# Patient Record
Sex: Female | Born: 1965 | Race: Black or African American | Hispanic: No | Marital: Single | Smoking: Former smoker
Health system: Southern US, Community
[De-identification: ages and names within clinical notes are randomized; demographics above are authoritative.]

## PROBLEM LIST (undated history)

## (undated) DIAGNOSIS — L309 Dermatitis, unspecified: Secondary | ICD-10-CM

## (undated) DIAGNOSIS — J45909 Unspecified asthma, uncomplicated: Secondary | ICD-10-CM

## (undated) DIAGNOSIS — E119 Type 2 diabetes mellitus without complications: Secondary | ICD-10-CM

## (undated) DIAGNOSIS — E785 Hyperlipidemia, unspecified: Secondary | ICD-10-CM

## (undated) DIAGNOSIS — I10 Essential (primary) hypertension: Secondary | ICD-10-CM

## (undated) HISTORY — DX: Dermatitis, unspecified: L30.9

## (undated) HISTORY — DX: Unspecified asthma, uncomplicated: J45.909

## (undated) HISTORY — DX: Hyperlipidemia, unspecified: E78.5

## (undated) HISTORY — PX: APPENDECTOMY: SHX54

---

## 2011-02-14 DIAGNOSIS — M129 Arthropathy, unspecified: Secondary | ICD-10-CM | POA: Insufficient documentation

## 2011-02-14 DIAGNOSIS — J310 Chronic rhinitis: Secondary | ICD-10-CM | POA: Insufficient documentation

## 2011-02-14 DIAGNOSIS — R1013 Epigastric pain: Secondary | ICD-10-CM | POA: Insufficient documentation

## 2011-02-14 DIAGNOSIS — E669 Obesity, unspecified: Secondary | ICD-10-CM | POA: Insufficient documentation

## 2011-02-14 DIAGNOSIS — Z720 Tobacco use: Secondary | ICD-10-CM | POA: Insufficient documentation

## 2011-02-14 DIAGNOSIS — G4700 Insomnia, unspecified: Secondary | ICD-10-CM | POA: Insufficient documentation

## 2013-12-20 DIAGNOSIS — M25561 Pain in right knee: Secondary | ICD-10-CM | POA: Insufficient documentation

## 2013-12-20 DIAGNOSIS — R600 Localized edema: Secondary | ICD-10-CM | POA: Insufficient documentation

## 2013-12-20 DIAGNOSIS — M7989 Other specified soft tissue disorders: Secondary | ICD-10-CM | POA: Insufficient documentation

## 2013-12-20 DIAGNOSIS — E119 Type 2 diabetes mellitus without complications: Secondary | ICD-10-CM | POA: Insufficient documentation

## 2013-12-20 DIAGNOSIS — I10 Essential (primary) hypertension: Secondary | ICD-10-CM | POA: Insufficient documentation

## 2016-11-25 DIAGNOSIS — H9209 Otalgia, unspecified ear: Secondary | ICD-10-CM | POA: Insufficient documentation

## 2016-11-25 DIAGNOSIS — H6090 Unspecified otitis externa, unspecified ear: Secondary | ICD-10-CM | POA: Insufficient documentation

## 2016-11-25 DIAGNOSIS — R700 Elevated erythrocyte sedimentation rate: Secondary | ICD-10-CM | POA: Insufficient documentation

## 2016-11-25 DIAGNOSIS — R519 Headache, unspecified: Secondary | ICD-10-CM | POA: Insufficient documentation

## 2016-11-27 DIAGNOSIS — H5213 Myopia, bilateral: Secondary | ICD-10-CM | POA: Insufficient documentation

## 2016-11-27 DIAGNOSIS — H52223 Regular astigmatism, bilateral: Secondary | ICD-10-CM | POA: Insufficient documentation

## 2016-11-27 DIAGNOSIS — Z135 Encounter for screening for eye and ear disorders: Secondary | ICD-10-CM | POA: Insufficient documentation

## 2016-11-28 DIAGNOSIS — H524 Presbyopia: Secondary | ICD-10-CM | POA: Insufficient documentation

## 2017-04-11 ENCOUNTER — Institutional Professional Consult (permissible substitution): Payer: Self-pay | Admitting: Internal Medicine

## 2017-09-03 DIAGNOSIS — G4733 Obstructive sleep apnea (adult) (pediatric): Secondary | ICD-10-CM | POA: Insufficient documentation

## 2017-09-03 DIAGNOSIS — E782 Mixed hyperlipidemia: Secondary | ICD-10-CM | POA: Insufficient documentation

## 2017-09-03 DIAGNOSIS — E559 Vitamin D deficiency, unspecified: Secondary | ICD-10-CM | POA: Insufficient documentation

## 2017-09-03 DIAGNOSIS — E213 Hyperparathyroidism, unspecified: Secondary | ICD-10-CM | POA: Insufficient documentation

## 2017-09-03 DIAGNOSIS — K219 Gastro-esophageal reflux disease without esophagitis: Secondary | ICD-10-CM | POA: Insufficient documentation

## 2018-04-03 ENCOUNTER — Encounter (HOSPITAL_COMMUNITY): Payer: Self-pay

## 2018-04-03 ENCOUNTER — Ambulatory Visit (HOSPITAL_COMMUNITY)
Admission: EM | Admit: 2018-04-03 | Discharge: 2018-04-03 | Disposition: A | Discharge status: Home / Self Care | Payer: 59 | Attending: Internal Medicine | Admitting: Internal Medicine

## 2018-04-03 DIAGNOSIS — M79671 Pain in right foot: Secondary | ICD-10-CM

## 2018-04-03 DIAGNOSIS — M79672 Pain in left foot: Secondary | ICD-10-CM | POA: Diagnosis not present

## 2018-04-03 HISTORY — DX: Essential (primary) hypertension: I10

## 2018-04-03 HISTORY — DX: Type 2 diabetes mellitus without complications: E11.9

## 2018-04-03 NOTE — ED Provider Notes (Signed)
[facility]    CSN: [id number] Arrival date & time: 04/03/18  1947     History   Chief Complaint Chief Complaint  Patient presents with  . Foot Pain    both  . Leg Pain    both    HPI Brianna Sellers is a 52 y.o. female.   52 year old female comes in for 3-day history of bilateral foot pain and swelling.  States right foot worse than the left.  Pain is at the lateral plantar surface, worse with weightbearing, and long hours of standing.  States bilateral  foot swelling, resolves after elevation, but slowly built up throughout the day.  Denies fever, chills, night sweats.  Denies erythema, increased warmth.  Denies chest pain, shortness of breath, orthopnea.  She has increased her activity in the past 3 weeks, has been walking more.  Denies any obvious injury/trauma.  She does take Mobic daily without obvious improvement. Denies history of heart disease.     Past Medical History:  Diagnosis Date  . Diabetes mellitus without complication ([place])   . Hypertension     There are no active problems to display for this patient.   History reviewed. No pertinent surgical history.  OB History   None      Home Medications    Prior to Admission medications   Medication Sig Start Date End Date Taking? Authorizing Provider  amLODipine (NORVASC) 5 MG tablet Take 5 mg by mouth daily.   Yes [provider]  atorvastatin (LIPITOR) 40 MG tablet Take 40 mg by mouth daily.   Yes [provider]  furosemide (LASIX) 20 MG tablet Take 20 mg by mouth.   Yes [provider]  losartan (COZAAR) 100 MG tablet Take 100 mg by mouth daily.   Yes [provider]  meloxicam (MOBIC) 15 MG tablet Take 15 mg by mouth daily.    Yes [provider]  metFORMIN (GLUCOPHAGE) 500 MG tablet Take 500 mg by mouth daily with breakfast.   Yes [provider]    Family History History reviewed. No pertinent family history.  Social History Social  History   Tobacco Use  . Smoking status: Not on file  Substance Use Topics  . Alcohol use: Not on file  . Drug use: Not on file     Allergies   Patient has no allergy information on record.   Review of Systems Review of Systems  Reason unable to perform ROS: See HPI as above.     Physical Exam Triage Vital Signs ED Triage Vitals [04/03/18 2025]  Enc Vitals Group     BP (!) 143/73     Pulse Rate 77     Resp 20     Temp 98.3 F (36.8 C)     Temp Source Temporal     SpO2 99 %     Weight      Height      Head Circumference      Peak Flow      Pain Score      Pain Loc      Pain Edu?      Excl. in [place]?    No data found.  Updated Vital Signs BP (!) 143/73 (BP Location: Left Arm)   Pulse 77   Temp 98.3 F (36.8 C) (Temporal)   Resp 20   LMP  (LMP Unknown)   SpO2 99%   Physical Exam  Constitutional: She is oriented to person, place, and time. She  appears well-developed and well-nourished. No distress.  HENT:  Head: Normocephalic and atraumatic.  Eyes: Pupils are equal, round, and reactive to light. Conjunctivae are normal.  Cardiovascular: Normal rate, regular rhythm and normal heart sounds. Exam reveals no gallop and no friction rub.  No murmur heard. Pulmonary/Chest: Effort normal and breath sounds normal. No accessory muscle usage or stridor. No respiratory distress. She has no decreased breath sounds. She has no wheezes. She has no rhonchi. She has no rales.  Musculoskeletal:  Bilateral foot swelling without erythema, increased warmth.  +1 pitting edema to the ankles.  No edema to the leg/calf.  No tenderness to palpation of ankle or foot.  Full range of motion.  Strength normal and equal bilaterally.  Sensation intact ankle bilaterally.  Pedal pulse 2+ and equal bilaterally.  Negative Homans.  Neurological: She is alert and oriented to person, place, and time.  Skin: Skin is warm and dry. She is not diaphoretic.   UC Treatments / Results  Labs (all labs  ordered are listed, but only abnormal results are displayed) Labs Reviewed - No data to display  EKG None  Radiology No results found.  Procedures Procedures (including critical care time)  Medications Ordered in UC Medications - No data to display  Initial Impression / Assessment and Plan / UC Course  I have reviewed the triage vital signs and the nursing notes.  Pertinent labs & imaging results that were available during my care of the patient were reviewed by me and considered in my medical decision making (see chart for details).    No alarming signs on exam.  Patient with bilateral leg swelling, equal without erythema or increased warmth.  Given swelling resolves with elevation, low suspicion of CHF, DVT.  Patient would like to defer starting new medications.  We will continue Mobic.  Ice compress, elevation compression for swelling.  Return precautions given.  Patient expresses understanding and agrees to plan.  Final Clinical Impressions(s) / UC Diagnoses   Final diagnoses:  Bilateral foot pain    ED Prescriptions    None        Ok Edwards, PA-C 04/03/18 2105

## 2018-04-03 NOTE — ED Triage Notes (Signed)
Pt presents with foot and leg pain in the right and the left side

## 2018-04-03 NOTE — Discharge Instructions (Addendum)
Continue mobic. Ice compress, elevation. Compression stocking/ace wrap for swelling.

## 2019-09-16 ENCOUNTER — Other Ambulatory Visit: Payer: Self-pay | Admitting: Urology

## 2019-09-16 ENCOUNTER — Other Ambulatory Visit: Payer: Self-pay | Admitting: Internal Medicine

## 2019-09-16 DIAGNOSIS — Z1231 Encounter for screening mammogram for malignant neoplasm of breast: Secondary | ICD-10-CM

## 2019-09-23 ENCOUNTER — Other Ambulatory Visit: Payer: Self-pay

## 2019-09-23 ENCOUNTER — Encounter: Payer: 59 | Admitting: Allergy and Immunology

## 2019-09-23 ENCOUNTER — Encounter: Payer: Self-pay | Admitting: Allergy and Immunology

## 2019-09-23 NOTE — Progress Notes (Signed)
This encounter was created in error - please disregard.

## 2019-10-20 ENCOUNTER — Ambulatory Visit
Admission: RE | Admit: 2019-10-20 | Discharge: 2019-10-20 | Disposition: A | Discharge status: Home / Self Care | Payer: 59 | Source: Ambulatory Visit | Attending: Internal Medicine | Admitting: Internal Medicine

## 2019-10-20 ENCOUNTER — Other Ambulatory Visit: Payer: Self-pay

## 2019-10-20 DIAGNOSIS — Z1231 Encounter for screening mammogram for malignant neoplasm of breast: Secondary | ICD-10-CM

## 2020-04-28 ENCOUNTER — Other Ambulatory Visit: Payer: Self-pay

## 2020-11-24 ENCOUNTER — Other Ambulatory Visit: Payer: Self-pay | Admitting: Internal Medicine

## 2020-11-24 DIAGNOSIS — Z1231 Encounter for screening mammogram for malignant neoplasm of breast: Secondary | ICD-10-CM

## 2021-01-17 ENCOUNTER — Other Ambulatory Visit: Payer: Self-pay

## 2021-01-17 ENCOUNTER — Ambulatory Visit
Admission: RE | Admit: 2021-01-17 | Discharge: 2021-01-17 | Disposition: A | Discharge status: Home / Self Care | Payer: 59 | Source: Ambulatory Visit | Attending: Internal Medicine | Admitting: Internal Medicine

## 2021-01-17 DIAGNOSIS — Z1231 Encounter for screening mammogram for malignant neoplasm of breast: Secondary | ICD-10-CM

## 2021-01-31 DIAGNOSIS — M1811 Unilateral primary osteoarthritis of first carpometacarpal joint, right hand: Secondary | ICD-10-CM | POA: Insufficient documentation

## 2021-01-31 DIAGNOSIS — G5602 Carpal tunnel syndrome, left upper limb: Secondary | ICD-10-CM | POA: Insufficient documentation

## 2021-01-31 DIAGNOSIS — G5622 Lesion of ulnar nerve, left upper limb: Secondary | ICD-10-CM | POA: Insufficient documentation

## 2021-12-11 ENCOUNTER — Encounter: Payer: Self-pay | Admitting: Podiatry

## 2021-12-11 ENCOUNTER — Ambulatory Visit (INDEPENDENT_AMBULATORY_CARE_PROVIDER_SITE_OTHER): Payer: 59

## 2021-12-11 ENCOUNTER — Ambulatory Visit: Payer: 59 | Admitting: Podiatry

## 2021-12-11 DIAGNOSIS — M7752 Other enthesopathy of left foot: Secondary | ICD-10-CM

## 2021-12-11 DIAGNOSIS — M19072 Primary osteoarthritis, left ankle and foot: Secondary | ICD-10-CM

## 2021-12-11 DIAGNOSIS — M2041 Other hammer toe(s) (acquired), right foot: Secondary | ICD-10-CM

## 2021-12-11 NOTE — Progress Notes (Signed)
?  Subjective:  ?Patient ID: Brianna Sellers, female    DOB: [birth year],  MRN: [id number] ?HPI ?Chief Complaint  ?Patient presents with  ? Toe Pain  ?  3rd toe left - aching, swelling x 2-4 weeks, thought she had cut the toe initially cause she was out in the year, never wears socks, even with closed shoes  ? New Patient (Initial Visit)  ? ? ?56 y.o. female presents with the above complaint.  ? ?ROS: Denies fever chills nausea vomiting muscle aches pains calf pain back pain chest pain shortness of breath. ? ?Past Medical History:  ?Diagnosis Date  ? Asthma   ? Diabetes mellitus without complication ([place])   ? Eczema   ? Hyperlipidemia   ? Hypertension   ? ?Past Surgical History:  ?Procedure Laterality Date  ? APPENDECTOMY    ? age 62 years old  ? ? ?Current Outpatient Medications:  ?  atorvastatin (LIPITOR) 40 MG tablet, Take by mouth., Disp: , Rfl:  ?  amLODipine (NORVASC) 10 MG tablet, Take 10 mg by mouth daily., Disp: , Rfl:  ?  losartan (COZAAR) 100 MG tablet, Take 100 mg by mouth daily., Disp: , Rfl:  ?  metFORMIN (GLUCOPHAGE-XR) 500 MG 24 hr tablet, Take 500 mg by mouth daily., Disp: , Rfl:  ? ?No Known Allergies ?Review of Systems ?Objective:  ?There were no vitals filed for this visit. ? ?General: Well developed, nourished, in no acute distress, alert and oriented x3  ? ?Dermatological: Skin is warm, dry and supple bilateral. Nails x 10 are well maintained; remaining integument appears unremarkable at this time. There are no open sores, no preulcerative lesions, no rash or signs of infection present. ? ?Vascular: Dorsalis Pedis artery and Posterior Tibial artery pedal pulses are 2/4 bilateral with immedate capillary fill time. Pedal hair growth present. No varicosities and no lower extremity edema present bilateral.  ? ?Neruologic: Grossly intact via light touch bilateral. Vibratory intact via tuning fork bilateral. Protective threshold with Semmes Wienstein monofilament intact to all pedal sites bilateral. Patellar  and Achilles deep tendon reflexes 2+ bilateral. No Babinski or clonus noted bilateral.  ? ?Musculoskeletal: No gross boney pedal deformities bilateral. No pain, crepitus, or limitation noted with foot and ankle range of motion bilateral. Muscular strength 5/5 in all groups tested bilateral.  Swelling at the DIPJ third digit of the left foot with some hypertrophic osseous nodularity at the DIPJ consistent with Heberden's nodes. ? ?Gait: Unassisted, Nonantalgic.  ? ? ?Radiographs: ? ?Radiographs of the left foot taken today demonstrate an osseously mature individual with no significant osseous abnormalities other than the DIPJ of the left third toe which does demonstrate joint space narrowing subchondral sclerosis and spurring.  This is consistent with most likely a traumatic injury.  Now resulting in osteoarthritis. ? ?Assessment & Plan:  ? ?Assessment: Osteoarthritis DIPJ third digit left foot. ? ?Plan: Suggested that she utilize Voltaren cream should this not alleviate her symptoms she is notify us immediately. ? ? ? ? ?Brianna Pesch T. [place], DPM ?

## 2021-12-17 ENCOUNTER — Other Ambulatory Visit: Payer: Self-pay | Admitting: Internal Medicine

## 2021-12-17 DIAGNOSIS — Z1231 Encounter for screening mammogram for malignant neoplasm of breast: Secondary | ICD-10-CM

## 2022-01-21 ENCOUNTER — Ambulatory Visit
Admission: RE | Admit: 2022-01-21 | Discharge: 2022-01-21 | Disposition: A | Discharge status: Home / Self Care | Payer: 59 | Source: Ambulatory Visit | Attending: Internal Medicine | Admitting: Internal Medicine

## 2022-01-21 DIAGNOSIS — Z1231 Encounter for screening mammogram for malignant neoplasm of breast: Secondary | ICD-10-CM

## 2022-02-07 ENCOUNTER — Encounter: Payer: Self-pay | Admitting: Podiatry

## 2022-02-07 ENCOUNTER — Ambulatory Visit (INDEPENDENT_AMBULATORY_CARE_PROVIDER_SITE_OTHER): Payer: 59 | Admitting: Podiatry

## 2022-02-07 DIAGNOSIS — L603 Nail dystrophy: Secondary | ICD-10-CM | POA: Diagnosis not present

## 2022-02-07 DIAGNOSIS — M205X2 Other deformities of toe(s) (acquired), left foot: Secondary | ICD-10-CM | POA: Diagnosis not present

## 2022-02-07 NOTE — Progress Notes (Signed)
She presents today concerned about the curling of her toenails states that they have been bothering her for quite some time she is also concerned about the third toe on the left foot it is thick and she is wondering when that arthritis will calm down.  Objective: Vital signs stable alert and oriented x3 pulses are palpable neurologic sensorium is intact she does have DIPJ osteoarthritis and thickening of the tissue at the DIPJ third toe left foot.  It is flexible in nature and we could consider a flexor tenotomy on that at some point.  Otherwise her toenails do demonstrate onychocryptosis and thickening with subungual debris.  Assessment: Nail dystrophy.  Also osteoarthritis DIPJ third digit left foot.  Plan: Samples of skin and nail were taken today for pathologic evaluation follow-up with her in 1 month discussed the use diclofenac gel

## 2022-03-07 ENCOUNTER — Ambulatory Visit: Payer: 59 | Admitting: Podiatry

## 2022-03-07 ENCOUNTER — Encounter: Payer: Self-pay | Admitting: Podiatry

## 2022-03-07 DIAGNOSIS — L603 Nail dystrophy: Secondary | ICD-10-CM | POA: Diagnosis not present

## 2022-03-07 DIAGNOSIS — Z79899 Other long term (current) drug therapy: Secondary | ICD-10-CM

## 2022-03-07 NOTE — Progress Notes (Signed)
She presents today chief concern of her nail pathology.  Objective: Pathology result does demonstrate Trichophyton m..  Assessment: Onychomycosis.  Plan: Discussed etiology pathology conservative surgical therapies at this point in time we will go ahead and start with laser therapy.  Should laser therapy not resolve it then we will consider oral therapy.

## 2022-04-04 ENCOUNTER — Ambulatory Visit (INDEPENDENT_AMBULATORY_CARE_PROVIDER_SITE_OTHER): Payer: 59 | Admitting: Pulmonary Disease

## 2022-04-04 ENCOUNTER — Encounter: Payer: Self-pay | Admitting: Pulmonary Disease

## 2022-04-04 DIAGNOSIS — G4733 Obstructive sleep apnea (adult) (pediatric): Secondary | ICD-10-CM

## 2022-04-04 DIAGNOSIS — Z720 Tobacco use: Secondary | ICD-10-CM

## 2022-04-04 DIAGNOSIS — R600 Localized edema: Secondary | ICD-10-CM | POA: Diagnosis not present

## 2022-04-04 NOTE — Assessment & Plan Note (Signed)
She would qualify for lung cancer screening program

## 2022-04-04 NOTE — Patient Instructions (Signed)
  X obtain sleep study from your sleep doctor in [place]  X obtain CPAP download from [facility]  X schedule CPAP titration study Based on this we will make adjustments to your CPAP machine  If you remain sleepy in spite of febrile, then we can consider medications

## 2022-04-04 NOTE — Assessment & Plan Note (Signed)
-   May be related to amlodipine

## 2022-04-04 NOTE — Assessment & Plan Note (Signed)
She appears to be compliant with her CPAP by report.  But she remains sleepy. Differential includes OSA, other sleep disorder such as narcolepsy, poor compliance  X obtain sleep study from your sleep doctor in [place]  X obtain CPAP download from [facility]  X schedule CPAP titration study Based on this we will make adjustments to your CPAP machine  If you remain sleepy in spite of febrile, then we can consider medications

## 2022-04-04 NOTE — Progress Notes (Signed)
Subjective:    Patient ID: Brianna Sellers, female    DOB: May 14, 1966, 56 y.o.   MRN: [id number]  HPI  56 year old ex-smoker presents for management of OSA. She reports that she was diagnosed in [place] more than 5 years ago when she presented with excessive daytime fatigue and sleepiness.  She was set up with CPAP after home sleep test, DME was [employer] [employer].  CPAP definitely helped her to sleep better but she would still wake up tired. She reported persistent sleepiness to her sleep dog but nothing was done about this and she stopped follow-up during the [place] pandemic. Reports sleepiness score is 0 but she reports excessive tiredness. 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 Bedtime is between 11 PM and 2 AM, sleep latency is minimal, she sleeps on her back with 1 pillow and rolls over on her left side, reports 1-2 nocturnal awakenings due to nocturia and is out of bed by 7:30 AM feeling tired with dryness of mouth  There is no history suggestive of cataplexy, sleep paralysis or parasomnias  She smoked between 1 to 2 pack/day for 30 years, quit 5 years ago  She reports dyspnea on exertion which Cherina attributes to weight gain and mild pedal edema.  Medication review shows amlodipine  PMH -hypertension, diabetes   Past Medical History:  Diagnosis Date   Asthma    Diabetes mellitus without complication ([place])    Eczema    Hyperlipidemia    Hypertension    Past Surgical History:  Procedure Laterality Date   APPENDECTOMY     age 39 years old    Allergies  Allergen Reactions   Bacitracin      Social History   Socioeconomic History   Marital status: Single    Spouse name: Not on file   Number of children: Not on file   Years of education: Not on file   Highest education level: Not on file  Occupational History   Not on file  Tobacco Use   Smoking status: Former    Packs/day: 1.50    Years: 42.00    Total pack years: 63.00     Types: Cigarettes    Quit date: 12/22/2015    Years since quitting: 6.2   Smokeless tobacco: Never  Vaping Use   Vaping Use: Never used  Substance and Sexual Activity   Alcohol use: Yes   Drug use: Yes    Types: Marijuana    Comment: uses every day   Sexual activity: Not on file  Other Topics Concern   Not on file  Social History Narrative   Not on file   Social Determinants of Health   Financial Resource Strain: Not on file  Food Insecurity: Not on file  Transportation Needs: Not on file  Physical Activity: Not on file  Stress: Not on file  Social Connections: Not on file  Intimate Partner Violence: Not on file    Family History  Problem Relation Age of Onset   Allergic rhinitis Mother    Urticaria Mother    Asthma Daughter    Angioedema Neg Hx    Eczema Neg Hx    Immunodeficiency Neg Hx      Review of Systems Constitutional: negative for anorexia, fevers and sweats  Eyes: negative for irritation, redness and visual disturbance  Ears, nose, mouth, throat, and face: negative for earaches, epistaxis, nasal congestion and sore throat  Respiratory: negative for cough sputum and wheezing  Cardiovascular: negative for chest pain, dyspnea, orthopnea, palpitations and syncope  Gastrointestinal: negative for abdominal pain, constipation, diarrhea, melena, nausea and vomiting  Genitourinary:negative for dysuria, frequency and hematuria  Hematologic/lymphatic: negative for bleeding, easy bruising and lymphadenopathy  Musculoskeletal:negative for arthralgias, muscle weakness and stiff joints  Neurological: negative for coordination problems, gait problems, headaches and weakness  Endocrine: negative for diabetic symptoms including polydipsia, polyuria and weight loss     Objective:   Physical Exam  Gen. Pleasant, obese, in no distress, normal affect ENT - no pallor,icterus, no post nasal drip, class 2-3 airway Neck: No JVD, no thyromegaly, no carotid bruits Lungs: no  use of accessory muscles, no dullness to percussion, decreased without rales or rhonchi  Cardiovascular: Rhythm regular, heart sounds  normal, no murmurs or gallops, no peripheral edema Abdomen: soft and non-tender, no hepatosplenomegaly, BS normal. Musculoskeletal: No deformities, no cyanosis or clubbing Neuro:  alert, non focal, no tremors       Assessment & Plan:

## 2022-04-09 DIAGNOSIS — G5601 Carpal tunnel syndrome, right upper limb: Secondary | ICD-10-CM | POA: Insufficient documentation

## 2022-04-12 ENCOUNTER — Ambulatory Visit (INDEPENDENT_AMBULATORY_CARE_PROVIDER_SITE_OTHER): Payer: Self-pay | Admitting: Podiatry

## 2022-04-12 DIAGNOSIS — L603 Nail dystrophy: Secondary | ICD-10-CM

## 2022-04-12 DIAGNOSIS — B351 Tinea unguium: Secondary | ICD-10-CM

## 2022-04-12 NOTE — Progress Notes (Signed)
Patient presents today for the 1st laser treatment. Diagnosed with nail dystrophy by Dr. Milinda Pointer.    Toenail most affected bilateral hallux and bilateral 2nd toe.    All other systems are negative.   Nails were filed thin. Laser therapy was administered to 1-5 toenails bilateral and patient tolerated the treatment well. All safety precautions were in place.     Follow up in 4 weeks for laser # 2.  ~One pass was completed to non- affected nails. ~

## 2022-05-09 ENCOUNTER — Ambulatory Visit: Payer: Self-pay | Admitting: Podiatry

## 2022-05-09 DIAGNOSIS — B351 Tinea unguium: Secondary | ICD-10-CM

## 2022-05-09 DIAGNOSIS — L603 Nail dystrophy: Secondary | ICD-10-CM

## 2022-05-09 NOTE — Progress Notes (Signed)
Patient presents today for the 2nd laser treatment. Diagnosed with nail dystrophy by Dr. Milinda Pointer.    Toenail most affected bilateral hallux and bilateral 2nd toe.     All other systems are negative.   Nails were filed thin. Laser therapy was administered to 1-5 toenails bilateral and patient tolerated the treatment well. All safety precautions were in place.      Follow up in 4 weeks for laser # 3.   ~One pass was completed to non- affected nails. ~

## 2022-05-29 ENCOUNTER — Ambulatory Visit (HOSPITAL_BASED_OUTPATIENT_CLINIC_OR_DEPARTMENT_OTHER): Payer: 59 | Admitting: Pulmonary Disease

## 2022-05-31 ENCOUNTER — Ambulatory Visit (HOSPITAL_BASED_OUTPATIENT_CLINIC_OR_DEPARTMENT_OTHER): Payer: 59 | Attending: Pulmonary Disease | Admitting: Pulmonary Disease

## 2022-05-31 DIAGNOSIS — G4733 Obstructive sleep apnea (adult) (pediatric): Secondary | ICD-10-CM | POA: Diagnosis present

## 2022-05-31 DIAGNOSIS — R400 Somnolence: Secondary | ICD-10-CM | POA: Insufficient documentation

## 2022-06-03 ENCOUNTER — Encounter: Payer: Self-pay | Admitting: Internal Medicine

## 2022-06-05 ENCOUNTER — Other Ambulatory Visit: Payer: Self-pay

## 2022-06-05 DIAGNOSIS — G4733 Obstructive sleep apnea (adult) (pediatric): Secondary | ICD-10-CM

## 2022-06-05 NOTE — Procedures (Signed)
Patient Name: Brianna Sellers, Schmit Study Date: 05/31/2022 Gender: Female D.O.B: Jul 13, 1966 Age (years): 37 Referring Provider: Kara Mead MD, ABSM Height (inches): 67 Interpreting Physician: Kara Mead MD, ABSM Weight (lbs): 367 RPSGT: Baxter Flattery BMI: 66 MRN: [id number] Neck Size: 18.50 <br> <br> CLINICAL INFORMATION The patient with known OSA  is referred for a CPAP titration to treat sleep apnea.   SLEEP STUDY TECHNIQUE As per the AASM Manual for the Scoring of Sleep and Associated Events v2.3 (April 2016) with a hypopnea requiring 4% desaturations.  The channels recorded and monitored were frontal, central and occipital EEG, electrooculogram (EOG), submentalis EMG (chin), nasal and oral airflow, thoracic and abdominal wall motion, anterior tibialis EMG, snore microphone, electrocardiogram, and pulse oximetry. Continuous positive airway pressure (CPAP) was initiated at the beginning of the study and titrated to treat sleep-disordered breathing.  MEDICATIONS Medications self-administered by patient taken the night of the study : N/A  TECHNICIAN COMMENTS Comments added by technician: Patient had difficulty initiating sleep. Comments added by scorer: N/A RESPIRATORY PARAMETERS Optimal PAP Pressure (cm): 12 AHI at Optimal Pressure (/hr): 6.9 Overall Minimal O2 (%): 79.0 Supine % at Optimal Pressure (%): 100 Minimal O2 at Optimal Pressure (%): 85.0   SLEEP ARCHITECTURE The study was initiated at 11:25:58 PM and ended at 5:25:16 AM.  Sleep onset time was 24.7 minutes and the sleep efficiency was 76.3%%. The total sleep time was 274.1 minutes.  The patient spent 3.1%% of the night in stage N1 sleep, 81.8%% in stage N2 sleep, 0.2%% in stage N3 and 15% in REM.Stage REM latency was 213.0 minutes  Wake after sleep onset was 60.5. Alpha intrusion was absent. Supine sleep was 72.82%.  CARDIAC DATA The 2 lead EKG demonstrated sinus rhythm. The mean heart rate was 82.4 beats per minute.  Other EKG findings include: None.   LEG MOVEMENT DATA The total Periodic Limb Movements of Sleep (PLMS) were 0. The PLMS index was 0.0. A PLMS index of <15 is considered normal in adults.  IMPRESSIONS - The optimal PAP pressure was 12 cm of water. - Oxygen desaturations were observed during this titration (min O2 = 79.0%). Mild desaturations especially during REM sleep, total 14 mins with sat <88% - No snoring was audible during this study. - No cardiac abnormalities were observed during this study. - Clinically significant periodic limb movements were not noted during this study. Arousals associated with PLMs were rare.   DIAGNOSIS - Obstructive Sleep Apnea (G47.33)   RECOMMENDATIONS - Trial of CPAP therapy on 12 cm H2O with a Medium size Resmed Nasal Pillow AirFit P30 mask and heated humidification. - Avoid alcohol, sedatives and other CNS depressants that may worsen sleep apnea and disrupt normal sleep architecture. - Sleep hygiene should be reviewed to assess factors that may improve sleep quality. - Weight management and regular exercise should be initiated or continued. - Return to Sleep Center for re-evaluation after 4 weeks of therapy   Kara Mead MD Board Certified in [place]

## 2022-06-11 ENCOUNTER — Ambulatory Visit (INDEPENDENT_AMBULATORY_CARE_PROVIDER_SITE_OTHER): Payer: 59

## 2022-06-11 DIAGNOSIS — L603 Nail dystrophy: Secondary | ICD-10-CM

## 2022-06-11 DIAGNOSIS — B351 Tinea unguium: Secondary | ICD-10-CM

## 2022-06-11 NOTE — Progress Notes (Signed)
Patient presents today for the 3nd laser treatment. Diagnosed with nail dystrophy by Dr. Milinda Pointer.    Toenail most affected bilateral hallux and bilateral 2nd toe.     All other systems are negative.   Nails were filed thin. Laser therapy was administered to 1-5 toenails bilateral and patient tolerated the treatment well. All safety precautions were in place.      Follow up in 4 weeks for laser # 4.

## 2022-06-14 ENCOUNTER — Other Ambulatory Visit: Payer: 59

## 2022-06-28 ENCOUNTER — Other Ambulatory Visit: Payer: Self-pay | Admitting: Internal Medicine

## 2022-06-28 DIAGNOSIS — I739 Peripheral vascular disease, unspecified: Secondary | ICD-10-CM

## 2022-07-05 ENCOUNTER — Ambulatory Visit: Payer: 59 | Admitting: Primary Care

## 2022-07-05 ENCOUNTER — Encounter: Payer: Self-pay | Admitting: Primary Care

## 2022-07-05 VITALS — BP 124/64 | HR 75 | Temp 98.4°F | Ht 67.0 in | Wt 344.8 lb

## 2022-07-05 DIAGNOSIS — Z720 Tobacco use: Secondary | ICD-10-CM

## 2022-07-05 DIAGNOSIS — G4733 Obstructive sleep apnea (adult) (pediatric): Secondary | ICD-10-CM

## 2022-07-05 NOTE — Assessment & Plan Note (Signed)
-   Patient is 100% compliant with CPAP use over the last 2 weeks. Daytime sleepiness has improved. She had CPAP titration study on 05/31/22 that showed optimal pressure 12cm h20. Current pressure setting 12cm h20; Residual AHI 0.5/hr. DME company is [facility].

## 2022-07-05 NOTE — Patient Instructions (Signed)
Nice seeing you today Brianna Sellers, glad you are doing well Sleep apnea is well controlled on current CPAP pressure settings- no changes needed today   Recommendations: Continue to wear CPAP every night for 6 hours or longer Let us know if you would like referral to lung cancer screening program d/t past smoking history  Follow-up: 6 months with Dr. Elsworth Soho or sooner if needed   CPAP and BIPAP Information CPAP and BIPAP are methods that use air pressure to keep your airways open and to help you breathe well. CPAP and BIPAP use different amounts of pressure. Your health care provider will tell you whether CPAP or BIPAP would be more helpful for you. CPAP stands for "continuous positive airway pressure." With CPAP, the amount of pressure stays the same while you breathe in (inhale) and out (exhale). BIPAP stands for "bi-level positive airway pressure." With BIPAP, the amount of pressure will be higher when you inhale and lower when you exhale. This allows you to take larger breaths. CPAP or BIPAP may be used in the hospital, or your health care provider may want you to use it at home. You may need to have a sleep study before your health care provider can order a machine for you to use at home. What are the advantages? CPAP or BIPAP can be helpful if you have: Sleep apnea. Chronic obstructive pulmonary disease (COPD). Heart failure. Medical conditions that cause muscle weakness, including muscular dystrophy or amyotrophic lateral sclerosis (ALS). Other problems that cause breathing to be shallow, weak, abnormal, or difficult. CPAP and BIPAP are most commonly used for obstructive sleep apnea (OSA) to keep the airways from collapsing when the muscles relax during sleep. What are the risks? Generally, this is a safe treatment. However, problems may occur, including: Irritated skin or skin sores if the mask does not fit properly. Dry or stuffy nose or nosebleeds. Dry mouth. Feeling gassy or  bloated. Sinus or lung infection if the equipment is not cleaned properly. When should CPAP or BIPAP be used? In most cases, the mask only needs to be worn during sleep. Generally, the mask needs to be worn throughout the night and during any daytime naps. People with certain medical conditions may also need to wear the mask at other times, such as when they are awake. Follow instructions from your health care provider about when to use the machine. What happens during CPAP or BIPAP?  Both CPAP and BIPAP are provided by a small machine with a flexible plastic tube that attaches to a plastic mask that you wear. Air is blown through the mask into your nose or mouth. The amount of pressure that is used to blow the air can be adjusted on the machine. Your health care provider will set the pressure setting and help you find the best mask for you. Tips for using the mask Because the mask needs to be snug, some people feel trapped or closed-in (claustrophobic) when first using the mask. If you feel this way, you may need to get used to the mask. One way to do this is to hold the mask loosely over your nose or mouth and then gradually apply the mask more snugly. You can also gradually increase the amount of time that you use the mask. Masks are available in various types and sizes. If your mask does not fit well, talk with your health care provider about getting a different one. Some common types of masks include: Full face masks, which fit over  the mouth and nose. Nasal masks, which fit over the nose. Nasal pillow or prong masks, which fit into the nostrils. If you are using a mask that fits over your nose and you tend to breathe through your mouth, a chin strap may be applied to help keep your mouth closed. Use a skin barrier to protect your skin as told by your health care provider. Some CPAP and BIPAP machines have alarms that may sound if the mask comes off or develops a leak. If you have trouble with  the mask, it is very important that you talk with your health care provider about finding a way to make the mask easier to tolerate. Do not stop using the mask. There could be a negative impact on your health if you stop using the mask. Tips for using the machine Place your CPAP or BIPAP machine on a secure table or stand near an electrical outlet. Know where the on/off switch is on the machine. Follow instructions from your health care provider about how to set the pressure on your machine and when you should use it. Do not eat or drink while the CPAP or BIPAP machine is on. Food or fluids could get pushed into your lungs by the pressure of the CPAP or BIPAP. For home use, CPAP and BIPAP machines can be rented or purchased through home health care companies. Many different brands of machines are available. Renting a machine before purchasing may help you find out which particular machine works well for you. Your health insurance company may also decide which machine you may get. Keep the CPAP or BIPAP machine and attachments clean. Ask your health care provider for specific instructions. Check the humidifier if you have a dry stuffy nose or nosebleeds. Make sure it is working correctly. Follow these instructions at home: Take over-the-counter and prescription medicines only as told by your health care provider. Ask if you can take sinus medicine if your sinuses are blocked. Do not use any products that contain nicotine or tobacco. These products include cigarettes, chewing tobacco, and vaping devices, such as e-cigarettes. If you need help quitting, ask your health care provider. Keep all follow-up visits. This is important. Contact a health care provider if: You have redness or pressure sores on your head, face, mouth, or nose from the mask or head gear. You have trouble using the CPAP or BIPAP machine. You cannot tolerate wearing the CPAP or BIPAP mask. Someone tells you that you snore even when  wearing your CPAP or BIPAP. Get help right away if: You have trouble breathing. You feel confused. Summary CPAP and BIPAP are methods that use air pressure to keep your airways open and to help you breathe well. If you have trouble with the mask, it is very important that you talk with your health care provider about finding a way to make the mask easier to tolerate. Do not stop using the mask. There could be a negative impact to your health if you stop using the mask. Follow instructions from your health care provider about when to use the machine. This information is not intended to replace advice given to you by your health care provider. Make sure you discuss any questions you have with your health care provider. Document Revised: 03/28/2021 Document Reviewed: 07/28/2020 Elsevier Patient Education  [place].

## 2022-07-05 NOTE — Progress Notes (Signed)
$'@Patient'L$  ID: Brianna Sellers, female    DOB: 05-Mar-1966, 56 y.o.   MRN: [id number]  Chief Complaint  Patient presents with   Follow-up    Doing well. Review CPAP report    Referring provider: Ezzard Flax, *  HPI: 56 year old female, former smoker quit in 2017 (63-pack-year history).  Past medical history significant for OSA, type 2 diabetes, hypertension, GERD, mixed hyperlipidemia.  Patient of Dr. Elsworth Soho, seen for initial consult on 04/04/2022.   07/05/2022 Patient presents today for OSA follow-up.  She was diagnosed with sleep apnea in [place] 1 to 5 years ago. She originally presented with symptoms of excessive daytime sleepiness.  Patient had CPAP titration study on 05/31/2022 that showed optimal PAP pressure 12 cm H2O.   She is doing well. No issues with CPAP mask or pressure settings. She is compliant with CPAP use. She states that daytime sleepiness is much better. She had a lot of bloating and GI symptoms previously which have now resolved. She was treated for H.pylori infection and found out she has a lot of food allergies. Patient qualifies for lung cancer screening program but she would like to hold off at this time.   Airview download 06/18/22-07/02/22 15/15 days used; 100% > 4 hours Average usage 7 hours 37 mins Pressure 12cm h20 Airleaks 2.1L/min (95%) AHI 0.5   Allergies  Allergen Reactions   Bacitracin     Immunization History  Administered Date(s) Administered   Influenza-Unspecified 06/26/2022   Moderna Sars-Covid-2 Vaccination 11/28/2019, 12/26/2019, 07/25/2020, 07/25/2020   Pneumococcal Polysaccharide-23 05/13/2012   Td 09/17/2017   Td (Adult) 09/17/2017   Tdap 06/17/2007   Zoster Recombinat (Shingrix) 02/28/2022    Past Medical History:  Diagnosis Date   Asthma    Diabetes mellitus without complication ([place])    Eczema    Hyperlipidemia    Hypertension     Tobacco History: Social History   Tobacco Use  Smoking Status Former   Packs/day:  1.50   Years: 42.00   Total pack years: 63.00   Types: Cigarettes   Quit date: 12/22/2015   Years since quitting: 6.5  Smokeless Tobacco Never   Counseling given: Not Answered   Outpatient Medications Prior to Visit  Medication Sig Dispense Refill   amLODipine (NORVASC) 10 MG tablet Take 10 mg by mouth daily.     atorvastatin (LIPITOR) 40 MG tablet Take by mouth.     Fluocinolone Acetonide 0.01 % OIL Place 1 drop in ear(s) daily as needed.     losartan (COZAAR) 100 MG tablet Take 100 mg by mouth daily.     metFORMIN (GLUCOPHAGE-XR) 500 MG 24 hr tablet Take 500 mg by mouth daily.     triamcinolone ointment (KENALOG) 0.1 % Apply 1 Application topically daily as needed.     No facility-administered medications prior to visit.   Review of Systems  Review of Systems  Constitutional: Negative.   HENT: Negative.    Respiratory: Negative.    Cardiovascular: Negative.    Physical Exam  BP 124/64 (BP Location: Right Arm, Patient Position: Sitting, Cuff Size: Large)   Pulse 75   Temp 98.4 F (36.9 C) (Oral)   Ht '5\' 7"'$  (1.702 m)   Wt (!) 344 lb 12.8 oz (156.4 kg)   SpO2 98%   BMI 54.00 kg/m  Physical Exam Constitutional:      General: She is not in acute distress.    Appearance: Normal appearance. She is obese. She is not ill-appearing.  HENT:  Head: Normocephalic and atraumatic.  Cardiovascular:     Rate and Rhythm: Normal rate.  Pulmonary:     Effort: Pulmonary effort is normal.     Breath sounds: Normal breath sounds.  Musculoskeletal:        General: Normal range of motion.  Skin:    General: Skin is warm and dry.  Neurological:     General: No focal deficit present.     Mental Status: She is alert and oriented to person, place, and time. Mental status is at baseline.  Psychiatric:        Mood and Affect: Mood normal.        Behavior: Behavior normal.        Thought Content: Thought content normal.        Judgment: Judgment normal.      Lab  Results:  CBC No results found for: "WBC", "RBC", "HGB", "HCT", "PLT", "MCV", "MCH", "MCHC", "RDW", "LYMPHSABS", "MONOABS", "EOSABS", "BASOSABS"  BMET No results found for: "NA", "K", "CL", "CO2", "GLUCOSE", "BUN", "CREATININE", "CALCIUM", "GFRNONAA", "GFRAA"  BNP No results found for: "BNP"  ProBNP No results found for: "PROBNP"  Imaging: No results found.   Assessment & Plan:   Obstructive sleep apnea syndrome - Patient is 100% compliant with CPAP use over the last 2 weeks. Daytime sleepiness has improved. She had CPAP titration study on 05/31/22 that showed optimal pressure 12cm h20. Current pressure setting 12cm h20; Residual AHI 0.5/hr. DME company is [place] [employer].   Tobacco abuse - Former smoker, quit 5 years ago - She qualifies for lung cancer screening program but she would like to hold off at this time.    Martyn Ehrich, NP 07/05/2022

## 2022-07-05 NOTE — Assessment & Plan Note (Signed)
-   Former smoker, quit 5 years ago - She qualifies for lung cancer screening program but she would like to hold off at this time.

## 2022-07-09 ENCOUNTER — Ambulatory Visit (INDEPENDENT_AMBULATORY_CARE_PROVIDER_SITE_OTHER): Payer: 59

## 2022-07-09 DIAGNOSIS — B351 Tinea unguium: Secondary | ICD-10-CM

## 2022-07-09 DIAGNOSIS — L603 Nail dystrophy: Secondary | ICD-10-CM

## 2022-07-09 NOTE — Progress Notes (Signed)
Patient presents today for the 4th laser treatment. Diagnosed with nail dystrophy by Dr. Milinda Pointer.    Toenail most affected bilateral hallux and bilateral 2nd toe.     All other systems are negative.   Nails were filed thin. Laser therapy was administered to 1-5 toenails bilateral and patient tolerated the treatment well. All safety precautions were in place.      Follow up in 6 weeks for laser # 5.

## 2022-07-11 ENCOUNTER — Ambulatory Visit: Payer: 59 | Admitting: Internal Medicine

## 2022-07-16 ENCOUNTER — Ambulatory Visit: Payer: 59 | Admitting: Internal Medicine

## 2022-08-20 ENCOUNTER — Other Ambulatory Visit: Payer: 59

## 2022-08-21 ENCOUNTER — Ambulatory Visit: Payer: 59 | Admitting: Internal Medicine

## 2022-08-21 ENCOUNTER — Encounter: Payer: Self-pay | Admitting: Internal Medicine

## 2022-08-21 DIAGNOSIS — R140 Abdominal distension (gaseous): Secondary | ICD-10-CM | POA: Diagnosis not present

## 2022-08-21 DIAGNOSIS — R110 Nausea: Secondary | ICD-10-CM

## 2022-08-21 DIAGNOSIS — Z1211 Encounter for screening for malignant neoplasm of colon: Secondary | ICD-10-CM

## 2022-08-21 NOTE — Patient Instructions (Addendum)
If you are age 57 or younger, your body mass index should be between 19-25. Your Body mass index is 54.5 kg/m. If this is out of the aformentioned range listed, please consider follow up with your Primary Care Provider.  ________________________________________________________  The [facility] providers would like to encourage you to use [facility] to communicate with providers for non-urgent requests or questions.  Due to long hold times on the telephone, sending your provider a message by [facility] may be a faster and more efficient way to get a response.  Please allow 48 business hours for a response.  Please remember that this is for non-urgent requests.  _______________________________________________________  Please purchase the following medications over the counter and take as directed:  You can use TUMS as needed for indigestion.  You have been scheduled for a colonoscopy. Please follow written instructions given to you at your visit today.  Please pick up your prep supplies at the pharmacy within the next 1-3 days. If you use inhalers (even only as needed), please bring them with you on the day of your procedure.  We have referred you to an Allergy Specialist.  Please contact our office if you have not heard from the office in 1-2 weeks.   Due to recent changes in healthcare laws, you may see the results of your imaging and laboratory studies on MyChart before your provider has had a chance to review them.  We understand that in some cases there may be results that are confusing or concerning to you. Not all laboratory results come back in the same time frame and the provider may be waiting for multiple results in order to interpret others.  Please give [place] 48 hours in order for your provider to thoroughly review all the results before contacting the office for clarification of your results.   Thank you for entrusting me with your care and choosing [facility].  Dr Lorenso Courier

## 2022-08-21 NOTE — Progress Notes (Signed)
Chief Complaint: Nausea  HPI : 56 year old female with history of obesity, asthma, DM, eczema, and OSA on CPAP presents with nausea  She used have some issues with nausea, but this improved significantly after she was treated for SIBO with 2 weeks of antibiotics. Her SIBO was diagnosed with breath testing. If she doesn't eat, then she will sometimes get nausea. She has not used any anti-emetic medications because she is not really like to take medications in general. Her last colonoscopy was in 2013. Denies vomiting. Denies dysphagia, chest burning, regurgitation, ab pain, diarrhea, constipation, unintentional weight loss, and blood in the stools. Denies family history of colon cancer. She smokes marijuana. She quit marijuana for several months but did not find significant improvement in her nausea. She has been tested for food allergies and has been found to be allergic to several foods including corn, wheat, and soy. Endorses bloating  Past Medical History:  Diagnosis Date   Asthma    Diabetes mellitus without complication ([place])    Eczema    Hyperlipidemia    Hypertension      Past Surgical History:  Procedure Laterality Date   APPENDECTOMY     age 19 years old   Family History  Problem Relation Age of Onset   Allergic rhinitis Mother    Urticaria Mother    Diabetes Maternal Grandmother    Asthma Daughter    Angioedema Neg Hx    Eczema Neg Hx    Immunodeficiency Neg Hx    Stomach cancer Neg Hx    Colon cancer Neg Hx    Social History   Tobacco Use   Smoking status: Former    Packs/day: 1.50    Years: 42.00    Total pack years: 63.00    Types: Cigarettes    Quit date: 12/22/2015    Years since quitting: 6.6   Smokeless tobacco: Never  Vaping Use   Vaping Use: Never used  Substance Use Topics   Alcohol use: Yes   Drug use: Yes    Types: Marijuana    Comment: uses every day   Current Outpatient Medications  Medication Sig Dispense Refill   amLODipine (NORVASC)  10 MG tablet Take 10 mg by mouth daily.     atorvastatin (LIPITOR) 40 MG tablet Take by mouth.     Fluocinolone Acetonide 0.01 % OIL Place 1 drop in ear(s) daily as needed.     losartan (COZAAR) 100 MG tablet Take 100 mg by mouth daily.     metFORMIN (GLUCOPHAGE-XR) 500 MG 24 hr tablet Take 500 mg by mouth daily.     triamcinolone ointment (KENALOG) 0.1 % Apply 1 Application topically daily as needed.     No current facility-administered medications for this visit.   Allergies  Allergen Reactions   Bacitracin    Review of Systems: All systems reviewed and negative except where noted in HPI.   Physical Exam: Ht '5\' 7"'$  (1.702 m)   Wt (!) 348 lb (157.9 kg)   BMI 54.50 kg/m  Constitutional: Pleasant,well-developed, female in no acute distress. HEENT: Normocephalic and atraumatic. Conjunctivae are normal. No scleral icterus. Cardiovascular: Normal rate, regular rhythm.  Pulmonary/chest: Effort normal and breath sounds normal. No wheezing, rales or rhonchi. Abdominal: Soft, nondistended, nontender. Bowel sounds active throughout. There are no masses palpable. No hepatomegaly. Extremities: No edema Neurological: Alert and oriented to person place and time. Skin: Skin is warm and dry. No rashes noted. Psychiatric: Normal mood and affect. Behavior is normal.  EGD in 2013 small bowel biopsies with small intestinal mucosa with intact villous architecture and focally increased intraepithelial lymphocytes   ASSESSMENT AND PLAN: Nausea Bloating Colon cancer screening Patient presents with nausea and bloating that improved after SIBO treatment but have not completely resolved. I asked the patient to trial Tums in case GERD was contributing. Will also check a KUB to see if patient has underlying constipation. Patient has been previously found to have some food allergies so I will plan to refer her to allergy in order to get more food allergy testing. Patient's last colonoscopy was in 2013 so will  plan to update her colon cancer screening by performing a colonoscopy. - Tums PRN to see if GERD is contributing - Check KUB - Referral to allergy for food allergy testing - Unsedated colonoscopy [facility] due to BMI>50  Christia Reading, MD  I spent 61 minutes of time, including in depth chart review, independent review of results as outlined above, communicating results with the patient directly, face-to-face time with the patient, coordinating care, ordering studies and medications as appropriate, and documentation.

## 2022-08-22 ENCOUNTER — Ambulatory Visit (HOSPITAL_COMMUNITY): Payer: 59 | Admitting: Certified Registered"

## 2022-08-22 ENCOUNTER — Encounter (HOSPITAL_COMMUNITY): Payer: Self-pay | Admitting: Internal Medicine

## 2022-08-22 ENCOUNTER — Other Ambulatory Visit: Payer: Self-pay

## 2022-08-22 ENCOUNTER — Encounter (HOSPITAL_COMMUNITY)
Admission: RE | Disposition: A | Discharge status: Home / Self Care | Payer: Self-pay | Source: Home / Self Care | Attending: Internal Medicine

## 2022-08-22 ENCOUNTER — Ambulatory Visit (HOSPITAL_COMMUNITY)
Admission: RE | Admit: 2022-08-22 | Discharge: 2022-08-22 | Disposition: A | Discharge status: Home / Self Care | Payer: 59 | Attending: Internal Medicine | Admitting: Internal Medicine

## 2022-08-22 DIAGNOSIS — Z87891 Personal history of nicotine dependence: Secondary | ICD-10-CM | POA: Insufficient documentation

## 2022-08-22 DIAGNOSIS — D123 Benign neoplasm of transverse colon: Secondary | ICD-10-CM | POA: Diagnosis not present

## 2022-08-22 DIAGNOSIS — K5730 Diverticulosis of large intestine without perforation or abscess without bleeding: Secondary | ICD-10-CM | POA: Insufficient documentation

## 2022-08-22 DIAGNOSIS — Z1211 Encounter for screening for malignant neoplasm of colon: Secondary | ICD-10-CM | POA: Diagnosis present

## 2022-08-22 DIAGNOSIS — R110 Nausea: Secondary | ICD-10-CM

## 2022-08-22 DIAGNOSIS — K648 Other hemorrhoids: Secondary | ICD-10-CM | POA: Insufficient documentation

## 2022-08-22 DIAGNOSIS — R140 Abdominal distension (gaseous): Secondary | ICD-10-CM

## 2022-08-22 HISTORY — PX: COLONOSCOPY WITH PROPOFOL: SHX5780

## 2022-08-22 HISTORY — PX: POLYPECTOMY: SHX5525

## 2022-08-22 SURGERY — COLONOSCOPY WITH PROPOFOL

## 2022-08-22 MED ORDER — SODIUM CHLORIDE 0.9 % IV SOLN: INTRAVENOUS | Status: DC

## 2022-08-22 SURGICAL SUPPLY — 22 items

## 2022-08-22 NOTE — Op Note (Signed)
[facility] Patient Name: Brianna Sellers Procedure Date: 08/22/2022 MRN: [id number] Attending MD: Georgian Co , , [id number] Date of Birth: [birth year] CSN: [id number] Age: 56 Admit Type: Outpatient Procedure:                Colonoscopy Indications:              Screening for colorectal malignant neoplasm Providers:                Adline Mango" Cordelia Pen, Technician Referring MD:              Medicines:                Monitored Anesthesia Care Complications:            No immediate complications. Estimated Blood Loss:     Estimated blood loss was minimal. Procedure:                Pre-Anesthesia Assessment:                           - Prior to the procedure, a History and Physical                            was performed, and patient medications and                            allergies were reviewed. The patient's tolerance of                            previous anesthesia was also reviewed. The risks                            and benefits of the procedure and the sedation                            options and risks were discussed with the patient.                            All questions were answered, and informed consent                            was obtained. Prior Anticoagulants: The patient has                            taken no anticoagulant or antiplatelet agents. ASA                            Grade Assessment: III - A patient with severe                            systemic disease. After reviewing the risks and  benefits, the patient was deemed in satisfactory                            condition to undergo the procedure.                           After obtaining informed consent, the colonoscope                            was passed under direct vision. Throughout the                            procedure, the patient's blood pressure, pulse, and                             oxygen saturations were monitored continuously. The                            CF-HQ190L ([id number]) Olympus colonoscope was                            introduced through the anus and advanced to the the                            cecum, identified by appendiceal orifice and                            ileocecal valve. The colonoscopy was performed                            without difficulty. The patient tolerated the                            procedure well. The quality of the bowel                            preparation was good. The ileocecal valve,                            appendiceal orifice, and rectum were photographed. Scope In: 10:41:05 AM Scope Out: 11:04:31 AM Scope Withdrawal Time: 0 hours 17 minutes 46 seconds  Total Procedure Duration: 0 hours 23 minutes 26 seconds  Findings:      Multiple diverticula were found in the sigmoid colon, descending colon,       transverse colon and ascending colon.      Two sessile polyps were found in the transverse colon. The polyps were 3       to 4 mm in size. These polyps were removed with a cold snare. Resection       and retrieval were complete.      Non-bleeding internal hemorrhoids were found during retroflexion. Impression:               - Diverticulosis in the sigmoid colon, in the  descending colon, in the transverse colon and in                            the ascending colon.                           - Two 3 to 4 mm polyps in the transverse colon,                            removed with a cold snare. Resected and retrieved.                           - Non-bleeding internal hemorrhoids. Moderate Sedation:      Not Applicable - Patient had care per Anesthesia. Recommendation:           - Discharge patient to home (with escort).                           - Await pathology results.                           - The findings and recommendations were discussed                            with the  patient. Procedure Code(s):        --- Professional ---                           [phone], Colonoscopy, flexible; with removal of                            tumor(s), polyp(s), or other lesion(s) by snare                            technique Diagnosis Code(s):        --- Professional ---                           Z12.11, Encounter for screening for malignant                            neoplasm of colon                           K64.8, Other hemorrhoids                           D12.3, Benign neoplasm of transverse colon (hepatic                            flexure or splenic flexure)                           K57.30, Diverticulosis of large intestine without                            perforation  or abscess without bleeding CPT copyright 2022 American Medical Association. All rights reserved. The codes documented in this report are preliminary and upon coder review may  be revised to meet current compliance requirements. Dr Georgian Co "Lyndee Leo" Lorenso Courier,  08/22/2022 11:07:55 AM Number of Addenda: 0

## 2022-08-22 NOTE — Anesthesia Preprocedure Evaluation (Deleted)
Anesthesia Evaluation  Patient identified by MRN, date of birth, ID band Patient awake    Reviewed: Allergy & Precautions, NPO status , Patient's Chart, lab work & pertinent test results  History of Anesthesia Complications Negative for: history of anesthetic complications  Airway        Dental   Pulmonary neg shortness of breath, asthma , sleep apnea , neg recent URI, former smoker          Cardiovascular hypertension, Pt. on medications      Neuro/Psych  Headaches  Neuromuscular disease    GI/Hepatic Neg liver ROS,GERD  ,,  Endo/Other  diabetes, Type 2, Oral Hypoglycemic Agents  Morbid obesity  Renal/GU No results found for: "CREATININE"'     Musculoskeletal  (+) Arthritis ,    Abdominal   Peds  Hematology No results found for: "WBC", "HGB", "HCT", "MCV", "PLT"    Anesthesia Other Findings   Reproductive/Obstetrics                             Anesthesia Physical Anesthesia Plan  ASA: 3  Anesthesia Plan: MAC   Post-op Pain Management: Minimal or no pain anticipated   Induction: Intravenous  PONV Risk Score and Plan: 2 and Propofol infusion and Treatment may vary due to age or medical condition  Airway Management Planned: Nasal Cannula, Natural Airway and Simple Face Mask  Additional Equipment: None  Intra-op Plan:   Post-operative Plan:   Informed Consent: I have reviewed the patients History and Physical, chart, labs and discussed the procedure including the risks, benefits and alternatives for the proposed anesthesia with the patient or authorized representative who has indicated his/her understanding and acceptance.     Dental advisory given  Plan Discussed with: CRNA  Anesthesia Plan Comments:        Anesthesia Quick Evaluation

## 2022-08-22 NOTE — H&P (Signed)
GASTROENTEROLOGY PROCEDURE H&P NOTE   Primary Care Physician: Ezzard Flax, MD    Reason for Procedure:   Colon cancer screening  Plan:    Colonoscopy  Patient is appropriate for endoscopic procedure(s) in the hospital setting.  The nature of the procedure, as well as the risks, benefits, and alternatives were carefully and thoroughly reviewed with the patient. Ample time for discussion and questions allowed. The patient understood, was satisfied, and agreed to proceed.     HPI: Brianna Sellers is a 56 y.o. female who presents for colonoscopy for evaluation of colon cancerscreening .  Patient was most recently seen in the Gastroenterology Clinic on 08/21/22.  No interval change in medical history since that appointment. Please refer to that note for full details regarding GI history and clinical presentation.   Past Medical History:  Diagnosis Date   Asthma    Diabetes mellitus without complication ([place])    Eczema    Hyperlipidemia    Hypertension     Past Surgical History:  Procedure Laterality Date   APPENDECTOMY     age 84 years old    Prior to Admission medications   Medication Sig Start Date End Date Taking? Authorizing Provider  amLODipine (NORVASC) 10 MG tablet Take 10 mg by mouth daily. 09/21/19  Yes [provider]  atorvastatin (LIPITOR) 40 MG tablet Take by mouth. 12/07/20  Yes [provider]  Fluocinolone Acetonide 0.01 % OIL Place 1 drop in ear(s) daily as needed. 03/18/22  Yes [provider]  losartan (COZAAR) 100 MG tablet Take 100 mg by mouth daily.   Yes [provider]  metFORMIN (GLUCOPHAGE-XR) 500 MG 24 hr tablet Take 500 mg by mouth daily. 09/14/21  Yes [provider]  triamcinolone ointment (KENALOG) 0.1 % Apply 1 Application topically daily as needed.   Yes [provider]    No current facility-administered medications for this encounter.    Allergies as of 08/21/2022 - Review Complete  08/21/2022  Allergen Reaction Noted   Bacitracin  03/07/2022    Family History  Problem Relation Age of Onset   Allergic rhinitis Mother    Urticaria Mother    Diabetes Maternal Grandmother    Asthma Daughter    Angioedema Neg Hx    Eczema Neg Hx    Immunodeficiency Neg Hx    Stomach cancer Neg Hx    Colon cancer Neg Hx     Social History   Socioeconomic History   Marital status: Single    Spouse name: Not on file   Number of children: Not on file   Years of education: Not on file   Highest education level: Not on file  Occupational History   Not on file  Tobacco Use   Smoking status: Former    Packs/day: 1.50    Years: 42.00    Total pack years: 63.00    Types: Cigarettes    Quit date: 12/22/2015    Years since quitting: 6.6   Smokeless tobacco: Never  Vaping Use   Vaping Use: Never used  Substance and Sexual Activity   Alcohol use: Yes   Drug use: Yes    Types: Marijuana    Comment: uses every day   Sexual activity: Yes  Other Topics Concern   Not on file  Social History Narrative   Not on file   Social Determinants of Health   Financial Resource Strain: Not on file  Food Insecurity: Not on file  Transportation Needs: Not  on file  Physical Activity: Not on file  Stress: Not on file  Social Connections: Not on file  Intimate Partner Violence: Not on file    Physical Exam: Vital signs in last 24 hours: There were no vitals taken for this visit. GEN: NAD EYE: Sclerae anicteric ENT: MMM CV: Non-tachycardic Pulm: No increased WOB GI: Soft NEURO:  Alert & Oriented   Christia Reading, MD [facility]   08/22/2022 8:48 AM

## 2022-08-22 NOTE — Discharge Instructions (Signed)
YOU HAD AN ENDOSCOPIC PROCEDURE TODAY: Refer to the procedure report and other information in the discharge instructions given to you for any specific questions about what was found during the examination. If this information does not answer your questions, please call [place] office at [phone] to clarify.  ° °YOU SHOULD EXPECT: Some feelings of bloating in the abdomen. Passage of more gas than usual. Walking can help get rid of the air that was put into your GI tract during the procedure and reduce the bloating. If you had a lower endoscopy (such as a colonoscopy or flexible sigmoidoscopy) you may notice spotting of blood in your stool or on the toilet paper. Some abdominal soreness may be present for a day or two, also. ° °DIET: Your first meal following the procedure should be a light meal and then it is ok to progress to your normal diet. A half-sandwich or bowl of soup is an example of a good first meal. Heavy or fried foods are harder to digest and may make you feel nauseous or bloated. Drink plenty of fluids but you should avoid alcoholic beverages for 24 hours. If you had a esophageal dilation, please see attached instructions for diet.   ° °ACTIVITY: Your care partner should take you home directly after the procedure. You should plan to take it easy, moving slowly for the rest of the day. You can resume normal activity the day after the procedure however YOU SHOULD NOT DRIVE, use power tools, machinery or perform tasks that involve climbing or major physical exertion for 24 hours (because of the sedation medicines used during the test).  ° °SYMPTOMS TO REPORT IMMEDIATELY: °A gastroenterologist can be reached at any hour. Please call [phone]  for any of the following symptoms:  °Following lower endoscopy (colonoscopy, flexible sigmoidoscopy) °Excessive amounts of blood in the stool  °Significant tenderness, worsening of abdominal pains  °Swelling of the abdomen that is new, acute  °Fever of 100° or  higher  °Following upper endoscopy (EGD, EUS, ERCP, esophageal dilation) °Vomiting of blood or coffee ground material  °New, significant abdominal pain  °New, significant chest pain or pain under the shoulder blades  °Painful or persistently difficult swallowing  °New shortness of breath  °Black, tarry-looking or red, bloody stools ° °FOLLOW UP:  °If any biopsies were taken you will be contacted by phone or by letter within the next 1-3 weeks. Call [phone]  if you have not heard about the biopsies in 3 weeks.  °Please also call with any specific questions about appointments or follow up tests. ° °

## 2022-08-23 ENCOUNTER — Encounter: Payer: Self-pay | Admitting: Internal Medicine

## 2022-08-23 LAB — SURGICAL PATHOLOGY

## 2022-08-26 ENCOUNTER — Encounter (HOSPITAL_COMMUNITY): Payer: Self-pay | Admitting: Internal Medicine

## 2022-09-25 ENCOUNTER — Ambulatory Visit (INDEPENDENT_AMBULATORY_CARE_PROVIDER_SITE_OTHER): Payer: 59

## 2022-09-25 DIAGNOSIS — L603 Nail dystrophy: Secondary | ICD-10-CM

## 2022-09-25 NOTE — Progress Notes (Signed)
Patient presents today for the 5th laser treatment. Diagnosed with nail dystrophy by Dr. Milinda Pointer.    Toenail most affected bilateral hallux and bilateral 2nd toe.     All other systems are negative.   Nails were filed thin. Laser therapy was administered to 1-5 toenails bilateral and patient tolerated the treatment well. All safety precautions were in place.      Follow up in 8 weeks for laser # 6.

## 2022-10-10 ENCOUNTER — Encounter: Payer: Self-pay | Admitting: Allergy & Immunology

## 2022-10-10 ENCOUNTER — Other Ambulatory Visit: Payer: Self-pay

## 2022-10-10 ENCOUNTER — Ambulatory Visit: Payer: 59 | Admitting: Allergy & Immunology

## 2022-10-10 VITALS — BP 124/72 | HR 82 | Temp 98.3°F | Resp 20 | Ht 67.0 in | Wt 345.5 lb

## 2022-10-10 DIAGNOSIS — Z91018 Allergy to other foods: Secondary | ICD-10-CM | POA: Diagnosis not present

## 2022-10-10 DIAGNOSIS — R140 Abdominal distension (gaseous): Secondary | ICD-10-CM | POA: Diagnosis not present

## 2022-10-10 DIAGNOSIS — G4733 Obstructive sleep apnea (adult) (pediatric): Secondary | ICD-10-CM

## 2022-10-10 NOTE — Patient Instructions (Addendum)
1. Multiple food allergies - Testing was positive to peanut, soy, hazelnut, cottonseed, green pea, navy bean, cantaloupe, and strawberry as well as mustard. - Copy of testing results provided. - Avoid these foods as well as the other foods you avoid such as wheat and red dye.   2. Return if symptoms worsen or fail to improve.    Please inform us of any Emergency Department visits, hospitalizations, or changes in symptoms. Call us before going to the ED for breathing or allergy symptoms since we might be able to fit you in for a sick visit. Feel free to contact us anytime with any questions, problems, or concerns.  It was a pleasure to meet you today!  Websites that have reliable patient information: 1. American Academy of Asthma, Allergy, and Immunology: www.aaaai.org 2. Food Allergy Research and Education (FARE): foodallergy.org 3. Mothers of Asthmatics: http://www.asthmacommunitynetwork.org 4. American College of Allergy, Asthma, and Immunology: www.acaai.org   COVID-19 Vaccine Information can be found at: [email or web address].[email or web address].uk For questions related to vaccine distribution or appointments, please email [email or web address] or call [phone].   We realize that you might be concerned about having an allergic reaction to the COVID19 vaccines. To help with that concern, WE ARE OFFERING THE COVID19 VACCINES IN OUR OFFICE! Ask the front desk for dates!     "Like" [place] on Facebook and Instagram for our latest updates!      A healthy democracy works best when [employer] participate! Make sure you are registered to vote! If you have moved or changed any of your contact information, you will need to get this updated before voting!  In some cases, you MAY be able to register to vote online: [email or web address]      Food Adult Perc - 10/10/22 1500     Time Antigen Placed [place]    Location Back    Number of allergen test 72     Control-buffer 50% Glycerol Negative    Control-Histamine 1 mg/ml 2+    1. Peanut --   4 x 6   2. Soybean --   5 x 10   3. Wheat Negative    4. Sesame Negative    5. Milk, cow Negative    6. Egg White, Chicken Negative    7. Casein Negative    8. Shellfish Mix Negative    9. Fish Mix Negative    10. Cashew Negative    11. Pecan Food Negative    12. [place] Negative    13. Almond Negative    14. Hazelnut --   4 x 12   15. [place] nut Negative    16. Coconut Negative    17. Pistachio Negative    18. Catfish Negative    19. Bass Negative    20. Trout Negative    21. Tuna Negative    22. Salmon Negative    23. Flounder Negative    24. Codfish Negative    25. Shrimp Negative    26. Crab Negative    27. Lobster Negative    28. Oyster Negative    29. Scallops Negative    30. Barley Negative    31. Oat  Negative    32. Rye  Negative    33. Hops Negative    34. Rice Negative    35. Cottonseed --   4 x 6   36. Saccharomyces Cerevisiae  Negative    37. Pork Negative    38. [place] Meat  Negative    39. Chicken Meat Negative    40. Beef Negative    41. Lamb Negative    42. Tomato Negative    43. White Potato Negative    44. Sweet Potato Negative    45. Pea, Green/English --   4 x 8   46. Navy Bean --   5 x 7   47. Mushrooms Negative    48. Avocado Negative    49. Onion Negative    50. Cabbage Negative    51. Carrots Negative    52. Celery Negative    53. Corn Negative    54. Cucumber Negative    55. Grape (White seedless) Negative    56. Orange  Negative    57. Banana Negative    58. Apple Negative    59. Peach Negative    60. Strawberry Negative    61. Cantaloupe --   8 x 12   62. Watermelon --   5 x 7   63. Pineapple Negative    64. Chocolate/Cacao bean Negative    65. [place] Gum Negative    66. Acacia (Arabic Gum) Negative    67. Cinnamon Negative    68. Nutmeg Negative    69. Ginger Negative     70. Garlic Negative    71. Pepper, black Negative    72. Mustard --   2 x 4

## 2022-10-10 NOTE — Progress Notes (Addendum)
NEW PATIENT  Date of Service/Encounter:  10/10/22  Consult requested by: Ezzard Flax, MD   Assessment:   Seasonal allergic rhinitis   Multiple food allergies - updated testing today  Plan/Recommendations:   1. Multiple food allergies - Testing was positive to peanut, soy, hazelnut, cottonseed, green pea, navy bean, cantaloupe, and strawberry as well as mustard. - Copy of testing results provided. - Avoid these foods as well as the other foods you avoid such as wheat and red dye.  - Discussed the poor negative predictive value of food testing with her.  2. Return if symptoms worsen or fail to improve.    This note in its entirety was forwarded to the Provider who requested this consultation.  Subjective:   Brianna Sellers is a 57 y.o. female presenting today for evaluation of  Chief Complaint  Patient presents with   Allergy Testing    Patient stated she is here to find out what food she allergic to    [name] has a history of the following: Patient Active Problem List   Diagnosis Date Noted   Arthritis of carpometacarpal [facility]) joint of right thumb 01/31/2021   Carpal tunnel syndrome of left wrist 01/31/2021   Cubital tunnel syndrome on left 01/31/2021   Gastroesophageal reflux disease 09/03/2017   Hyperparathyroidism ([place]) 09/03/2017   Mixed hyperlipidemia 09/03/2017   Obstructive sleep apnea syndrome 09/03/2017   Vitamin D deficiency 09/03/2017   Presbyopia 11/28/2016   Myopia of both eyes 11/27/2016   Regular astigmatism of both eyes 11/27/2016   Screening for eye condition 11/27/2016   ESR raised 11/25/2016   Headache disorder 11/25/2016   Otitis external 11/25/2016   Referred otalgia 11/25/2016   Diabetes mellitus, type 2 ([place]) 12/20/2013   HTN (hypertension) 12/20/2013   Knee pain, right 12/20/2013   Pedal edema 12/20/2013   Arthritis involving multiple sites 02/14/2011   Dyspepsia 02/14/2011   Insomnia 02/14/2011   Obesity 02/14/2011    Rhinitis 02/14/2011   Tobacco abuse 02/14/2011    History obtained from: chart review and patient.  Brianna Sellers was referred by Ezzard Flax, MD.     Brianna Sellers is a 57 y.o. female presenting for an evaluation of food allergies (she has a know history of this) . She moved here 5 years ago, but her PCP is still in [place]. She has been slowing moving all of her specialists to [place]. She moved here because the cost of living was more affordable.    Asthma/Respiratory Symptom History: She sees Dr. Elsworth Soho for OSA.  She does not have a history of asthma.   Allergic Rhinitis Symptom History: She had environmental allergy testing done somewhere in [place].  She reports that she is allergic to everything outside. She takes Claritin daily for 9 months out of the year. She has never been on allergy shots.   Food Allergy Symptom History: She has been feeling nauseous and queasy for years. She has been doing her elimination diet. It was not successful unfortunately. She is allergic to highly processed foods. She allergic to red dyes. Her sister who is a dietician who put her on a cleanse. She had a breath test with GI that showed it was positive to H pylori.  She did get better with the treatment of the H pylori. She had some blood work that showed positives to many foods (testing is not in the system). She is avoiding a number of different foods (corn, wheat, red dyes, preservatives). She is now  cooking a lot more than she was previously. She cannot have anything with soy because it "tears" her stomach up.   She does not have colds or sinus infections or pneumonias. She does not remember the last time that she got antibiotics.   Otherwise, there is no history of other atopic diseases, including asthma, food allergies, drug allergies, stinging insect allergies, or contact dermatitis. There is no significant infectious history. Vaccinations are up to date.    Past Medical History: Patient Active  Problem List   Diagnosis Date Noted   Arthritis of carpometacarpal [facility]) joint of right thumb 01/31/2021   Carpal tunnel syndrome of left wrist 01/31/2021   Cubital tunnel syndrome on left 01/31/2021   Gastroesophageal reflux disease 09/03/2017   Hyperparathyroidism ([place]) 09/03/2017   Mixed hyperlipidemia 09/03/2017   Obstructive sleep apnea syndrome 09/03/2017   Vitamin D deficiency 09/03/2017   Presbyopia 11/28/2016   Myopia of both eyes 11/27/2016   Regular astigmatism of both eyes 11/27/2016   Screening for eye condition 11/27/2016   ESR raised 11/25/2016   Headache disorder 11/25/2016   Otitis external 11/25/2016   Referred otalgia 11/25/2016   Diabetes mellitus, type 2 ([place]) 12/20/2013   HTN (hypertension) 12/20/2013   Knee pain, right 12/20/2013   Pedal edema 12/20/2013   Arthritis involving multiple sites 02/14/2011   Dyspepsia 02/14/2011   Insomnia 02/14/2011   Obesity 02/14/2011   Rhinitis 02/14/2011   Tobacco abuse 02/14/2011    Medication List:  Allergies as of 10/10/2022       Reactions   Bacitracin         Medication List        Accurate as of October 10, 2022 11:59 PM. If you have any questions, ask your nurse or doctor.          amLODipine 10 MG tablet Commonly known as: NORVASC Take 10 mg by mouth daily.   atorvastatin 40 MG tablet Commonly known as: LIPITOR Take by mouth.   Fluocinolone Acetonide 0.01 % Oil Place 1 drop in ear(s) daily as needed.   gabapentin 100 MG capsule Commonly known as: NEURONTIN Take 100 mg by mouth 2 (two) times daily.   losartan 100 MG tablet Commonly known as: COZAAR Take 100 mg by mouth daily.   metFORMIN 500 MG 24 hr tablet Commonly known as: GLUCOPHAGE-XR Take 500 mg by mouth daily.   Ozempic (0.25 or 0.5 MG/DOSE) 2 MG/3ML Sopn Generic drug: Semaglutide(0.25 or 0.5MG/DOS)   triamcinolone ointment 0.1 % Commonly known as: KENALOG Apply 1 Application topically daily as needed.        Birth  History: non-contributory  Developmental History: non-contributory  Past Surgical History: Past Surgical History:  Procedure Laterality Date   APPENDECTOMY     age 89 years old   COLONOSCOPY WITH PROPOFOL N/A 08/22/2022   Procedure: COLONOSCOPY WITH PROPOFOL;  Surgeon: Sharyn Creamer, MD;  Location: [facility];  Service: Gastroenterology;  Laterality: N/A;   POLYPECTOMY  08/22/2022   Procedure: POLYPECTOMY;  Surgeon: Sharyn Creamer, MD;  Location: [facility];  Service: Gastroenterology;;     Family History: Family History  Problem Relation Age of Onset   Allergic rhinitis Mother    Urticaria Mother    Diabetes Maternal Grandmother    Asthma Daughter    Angioedema Neg Hx    Eczema Neg Hx    Immunodeficiency Neg Hx    Stomach cancer Neg Hx    Colon cancer Neg Hx      Social  History: Brianna Sellers lives at home. There is carpeting throughout the home.  There is a cat inside of the home.  There are dust mite covers on the bed and the pillows.  There is no tobacco exposure.  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  There is no fume, chemical, or dust exposure.  There is no HEPA filter in the home.  She does not live near an interstate or industrial area.  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  For what ever reason, she seems to like it.   Review of Systems  Constitutional: Negative.  Negative for fever, malaise/fatigue and weight loss.  HENT: Negative.  Negative for congestion, ear discharge and ear pain.   Eyes:  Negative for pain, discharge and redness.  Respiratory:  Negative for cough, sputum production, shortness of breath and wheezing.   Cardiovascular: Negative.  Negative for chest pain and palpitations.  Gastrointestinal:  Negative for abdominal pain and heartburn.  Skin: Negative.  Negative for itching and rash.  Neurological:  Negative for dizziness and headaches.  Endo/Heme/Allergies:  Negative for  environmental allergies. Does not bruise/bleed easily.       Objective:   Blood pressure 124/72, pulse 82, temperature 98.3 F (36.8 C), resp. rate 20, height 5' 7"$  (1.702 m), weight (!) 345 lb 8 oz (156.7 kg), SpO2 95 %. Body mass index is 54.11 kg/m.     Physical Exam Constitutional:      Appearance: She is well-developed. She is obese.  HENT:     Head: Normocephalic and atraumatic.     Right Ear: Tympanic membrane, ear canal and external ear normal. No drainage, swelling or tenderness. Tympanic membrane is not injected, scarred, erythematous, retracted or bulging.     Left Ear: Tympanic membrane, ear canal and external ear normal. No drainage, swelling or tenderness. Tympanic membrane is not injected, scarred, erythematous, retracted or bulging.     Nose: No nasal deformity, septal deviation, mucosal edema or rhinorrhea.     Right Turbinates: Enlarged and swollen.     Left Turbinates: Enlarged and swollen.     Right Sinus: No maxillary sinus tenderness or frontal sinus tenderness.     Left Sinus: No maxillary sinus tenderness or frontal sinus tenderness.     Mouth/Throat:     Mouth: Mucous membranes are not pale and not dry.     Pharynx: Uvula midline.  Eyes:     General:        Right eye: No discharge.        Left eye: No discharge.     Conjunctiva/sclera: Conjunctivae normal.     Right eye: Right conjunctiva is not injected. No chemosis.    Left eye: Left conjunctiva is not injected. No chemosis.    Pupils: Pupils are equal, round, and reactive to light.  Cardiovascular:     Rate and Rhythm: Normal rate and regular rhythm.     Heart sounds: Normal heart sounds.  Pulmonary:     Effort: Pulmonary effort is normal. No tachypnea, accessory muscle usage or respiratory distress.     Breath sounds: Normal breath sounds. No wheezing, rhonchi or rales.  Chest:     Chest wall: No tenderness.  Abdominal:     Tenderness: There is no abdominal tenderness. There is no guarding  or rebound.  Lymphadenopathy:     Head:     Right side of head: No submandibular, tonsillar or occipital adenopathy.  Left side of head: No submandibular, tonsillar or occipital adenopathy.     Cervical: No cervical adenopathy.  Skin:    Coloration: Skin is not pale.     Findings: No abrasion, erythema, petechiae or rash. Rash is not papular, urticarial or vesicular.  Neurological:     Mental Status: She is alert.  Psychiatric:        Behavior: Behavior is cooperative.      Diagnostic studies:   Allergy Studies:     Routine   Control-buffer 50% Glycerol Negative      Control-Histamine 1 mg/ml 2+      Foods  1. Peanut --  4 x 6      2. Soybean --  5 x 10      3. Wheat Negative      4. Sesame Negative      5. Milk, cow Negative      6. Egg White, Chicken Negative      7. Casein Negative      8. Shellfish Mix Negative      9. Fish Mix Negative      10. Cashew Negative      11. Pecan Food Negative      12. [place] Negative      13. Almond Negative      14. Hazelnut --  4 x 12      15. [place] nut Negative      16. Coconut Negative      17. Pistachio Negative      18. Catfish Negative      19. Bass Negative      20. Trout Negative      21. Tuna Negative      22. Salmon Negative      23. Flounder Negative      24. Codfish Negative      25. Shrimp Negative      26. Crab Negative      27. Lobster Negative      28. Oyster Negative      29. Scallops Negative      30. Barley Negative      31. Oat Negative      32. Rye Negative      33. Hops Negative      34. Rice Negative      35. Cottonseed --  4 x 6      36. Saccharomyces Cerevisiae Negative      37. Pork Negative      38. [place] Meat Negative      39. Chicken Meat Negative      40. Beef Negative      41. Lamb Negative      42. Tomato Negative      43. White Potato Negative      44. Sweet Potato Negative      45. Pea, Green/English --  4 x 8      46. Navy Bean --  5 x 7      47. Mushrooms Negative       48. Avocado Negative      49. Onion Negative      50. Cabbage Negative      51. Carrots Negative      52. Celery Negative      53. Corn Negative      54. Cucumber Negative      55. Grape (White seedless) Negative      56. Orange Negative      57. Banana  Negative      58. Apple Negative      59. Peach Negative      60. Strawberry Negative      61. Cantaloupe --  8 x 12      62. Watermelon --  5 x 7      63. Pineapple Negative      64. Chocolate/Cacao bean Negative      65. [place] Gum Negative      66. Acacia (Arabic Gum) Negative      67. Cinnamon Negative      68. Nutmeg Negative      69. Ginger Negative      70. Garlic Negative      71. Pepper, black Negative      72. Mustard --  2 x 4        Allergy testing results were read and interpreted by myself, documented by clinical staff.         Salvatore Marvel, MD [facility] [place] [facility] [place]

## 2022-10-13 ENCOUNTER — Encounter: Payer: Self-pay | Admitting: Allergy & Immunology

## 2022-10-15 ENCOUNTER — Ambulatory Visit: Payer: 59 | Admitting: Internal Medicine

## 2022-10-15 ENCOUNTER — Encounter: Payer: Self-pay | Admitting: Internal Medicine

## 2022-10-15 VITALS — BP 118/74 | HR 89 | Ht 67.0 in | Wt 345.0 lb

## 2022-10-15 DIAGNOSIS — R110 Nausea: Secondary | ICD-10-CM | POA: Diagnosis not present

## 2022-10-15 DIAGNOSIS — R140 Abdominal distension (gaseous): Secondary | ICD-10-CM

## 2022-10-15 DIAGNOSIS — Z86010 Personal history of colonic polyps: Secondary | ICD-10-CM | POA: Diagnosis not present

## 2022-10-15 NOTE — Progress Notes (Signed)
   Chief Complaint: Nausea  HPI : 57 year old female with history of obesity, asthma, DM, eczema, and OSA on CPAP presents for follow up of nausea  Interval History: Overall she is feeling better. She only gets nauseous when she feels hungry. She did try Tums but did not think that this made a significant difference in her symptoms. She only gets bloated when she eats bread. She saw an allergist and had updated testing that was positive to peanut, soy, hazelnut, cottonseed, green pea, navy bean, cantaloupe, strawberry, and mustard.   Current Outpatient Medications  Medication Sig Dispense Refill   amLODipine (NORVASC) 10 MG tablet Take 10 mg by mouth daily.     atorvastatin (LIPITOR) 40 MG tablet Take by mouth.     Fluocinolone Acetonide 0.01 % OIL Place 1 drop in ear(s) daily as needed.     gabapentin (NEURONTIN) 100 MG capsule Take 100 mg by mouth 2 (two) times daily.     losartan (COZAAR) 100 MG tablet Take 100 mg by mouth daily.     metFORMIN (GLUCOPHAGE-XR) 500 MG 24 hr tablet Take 500 mg by mouth daily.     OZEMPIC, 0.25 OR 0.5 MG/DOSE, 2 MG/3ML SOPN      triamcinolone ointment (KENALOG) 0.1 % Apply 1 Application topically daily as needed.     No current facility-administered medications for this visit.   Physical Exam: BP 118/74   Pulse 89   Ht '5\' 7"'$  (1.702 m)   Wt (!) 345 lb (156.5 kg)   BMI 54.03 kg/m  Constitutional: Pleasant,well-developed, female in no acute distress. HEENT: Normocephalic and atraumatic. Conjunctivae are normal. No scleral icterus. Cardiovascular: Normal rate, regular rhythm.  Pulmonary/chest: Effort normal and breath sounds normal. No wheezing, rales or rhonchi. Abdominal: Soft, nondistended, nontender. Bowel sounds active throughout. There are no masses palpable. No hepatomegaly. Extremities: No edema Neurological: Alert and oriented to person place and time. Skin: Skin is warm and dry. No rashes noted. Psychiatric: Normal mood and affect. Behavior is  normal.  EGD in 2013 small bowel biopsies with small intestinal mucosa with intact villous architecture and focally increased intraepithelial lymphocytes   Colonoscopy 08/22/22:  Path: A. TRANSVERSE COLON, POLYPECTOMY:  Tubular adenoma  Negative for high-grade dysplasia and carcinoma   ASSESSMENT AND PLAN: Nausea Bloating History of colon polyps Patient has done better in terms of her nausea and bloating. She knows that there are certain triggers for her nausea such as feeling hungry and bloating such as eating bread. She saw an allergist and has been avoiding foods that she is allergic to. We updated her colon cancer screening by performing a colonoscopy in 08/2022, which showed two precancerous polyps. Her next colonoscopy will be due in 7 years for polyp surveillance.   - Next colonoscopy due in 08/2029 for polyp surveillance - RTC PRN  Christia Reading, MD  I spent 25 minutes of time, including independent review of results as outlined above, communicating results with the patient directly, face-to-face time with the patient, coordinating care, ordering studies and medications as appropriate, and documentation.

## 2022-10-15 NOTE — Patient Instructions (Signed)
_______________________________________________________  If your blood pressure at your visit was 140/90 or greater, please contact your primary care physician to follow up on this.  _______________________________________________________  If you are age 58 or older, your body mass index should be between 23-30. Your Body mass index is 54.03 kg/m. If this is out of the aforementioned range listed, please consider follow up with your Primary Care Provider.  If you are age 33 or younger, your body mass index should be between 19-25. Your Body mass index is 54.03 kg/m. If this is out of the aformentioned range listed, please consider follow up with your Primary Care Provider.   ________________________________________________________  The [facility] providers would like to encourage you to use [facility] to communicate with providers for non-urgent requests or questions.  Due to long hold times on the telephone, sending your provider a message by [facility] may be a faster and more efficient way to get a response.  Please allow 48 business hours for a response.  Please remember that this is for non-urgent requests.  _______________________________________________________  Jim Desanctis you are feeling better!  Please follow up as needed

## 2022-10-21 ENCOUNTER — Telehealth: Payer: Self-pay | Admitting: Allergy & Immunology

## 2022-10-21 MED ORDER — EPINEPHRINE 0.3 MG/0.3ML IJ SOAJ: 0.3000 mg | Freq: Once | INTRAMUSCULAR | 2 refills | Status: AC

## 2022-10-21 NOTE — Telephone Encounter (Signed)
Patient is requesting epi pen to be sent in to [facility], [place], [place] [address]  Best contact number: [phone]

## 2022-10-21 NOTE — Telephone Encounter (Signed)
I sent it in. Can someone call to make sure that she knows how to use it? I think she should know how to do it.

## 2022-11-12 ENCOUNTER — Encounter: Payer: Self-pay | Admitting: Neurology

## 2022-11-12 ENCOUNTER — Ambulatory Visit: Payer: 59 | Admitting: Neurology

## 2022-11-12 VITALS — BP 121/78 | HR 70 | Ht 67.0 in | Wt 345.0 lb

## 2022-11-12 DIAGNOSIS — R202 Paresthesia of skin: Secondary | ICD-10-CM | POA: Diagnosis not present

## 2022-11-12 MED ORDER — GABAPENTIN 300 MG PO CAPS: 300.0000 mg | ORAL_CAPSULE | Freq: Three times a day (TID) | ORAL | 11 refills | Status: DC | PRN

## 2022-11-12 NOTE — Progress Notes (Signed)
Chief Complaint  Patient presents with   New Patient (Initial Visit)    Rm 15, alone NP Paper referral for Polyneuropathy States pain is worst in both feet and toes       ASSESSMENT AND PLAN  Brianna Sellers is a 57 y.o. female   Small fiber neuropathy Obesity  Most likely due to her diabetes metabolic syndrome,  Laboratory evaluations for etiology  Higher dose of gabapentin 300 mg up to 3 tablets as needed  Referral to weight loss program  DIAGNOSTIC DATA (LABS, IMAGING, TESTING) - I reviewed patient records, labs, notes, testing and imaging myself where available.   MEDICAL HISTORY:  Brianna Sellers, is a 57 year old female seen in request by her primary care [place],  [place] [id number], Ezzard Flax, MD for evaluation of bilateral feet paresthesia, initial evaluation was on November 12, 2022  I reviewed and summarized the referring note. 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  PHYSICAL EXAMNIATION:  Gen: NAD, conversant, well nourised, well groomed                     Cardiovascular: Regular rate rhythm, no peripheral edema, warm, nontender. Eyes: Conjunctivae clear without exudates or hemorrhage Neck: Supple, no carotid bruits. Pulmonary: Clear to auscultation bilaterally   NEUROLOGICAL EXAM:  MENTAL STATUS: Speech/cognition: Awake, alert, oriented to history taking and casual conversation, morbid  obesity CRANIAL NERVES: CN II: Visual fields are full to confrontation. Pupils are round equal and briskly reactive to light. CN III, IV, VI: extraocular movement are normal. No ptosis. CN V: Facial sensation is intact to light touch CN VII: Face is symmetric with normal eye closure  CN VIII: Hearing is normal to causal conversation. CN IX, X: Phonation is normal. CN XI: Head turning and shoulder shrug are intact  MOTOR: There is no pronator drift of out-stretched arms. Muscle bulk and tone are normal. Muscle strength is normal.  REFLEXES: Reflexes are 2 and symmetric at the biceps, triceps, knees, and trace at ankles. Plantar responses are flexor.  SENSORY: Intact to light touch, pinprick and vibratory sensation are intact in fingers and toes.  COORDINATION: There is no trunk or limb dysmetria noted.  GAIT/STANCE: Posture is normal. Gait is steady with normal steps, obese,  REVIEW OF SYSTEMS:  Full 14 system review of systems performed and notable only for as above All other review of systems were negative.   ALLERGIES: Allergies  Allergen Reactions   Diphenhydramine Hcl Other (See Comments)    Uncontrolled sneezing   Bacitracin     HOME MEDICATIONS: Current Outpatient Medications  Medication Sig Dispense Refill   amLODipine (NORVASC) 10 MG tablet Take 10 mg by mouth daily.     atorvastatin (LIPITOR) 40 MG tablet Take by mouth.     Fluocinolone Acetonide 0.01 % OIL Place 1 drop in ear(s) daily as needed.     losartan (  COZAAR) 100 MG tablet Take 100 mg by mouth daily.     metFORMIN (GLUCOPHAGE-XR) 500 MG 24 hr tablet Take 500 mg by mouth daily.     OZEMPIC, 0.25 OR 0.5 MG/DOSE, 2 MG/3ML SOPN      triamcinolone ointment (KENALOG) 0.1 % Apply 1 Application topically daily as needed.     No current facility-administered medications for this visit.    PAST MEDICAL HISTORY: Past Medical History:  Diagnosis Date   Diabetes mellitus without complication ([place])     Eczema    Hyperlipidemia    Hypertension     PAST SURGICAL HISTORY: Past Surgical History:  Procedure Laterality Date   APPENDECTOMY     age 12 years old   COLONOSCOPY WITH PROPOFOL N/A 08/22/2022   Procedure: COLONOSCOPY WITH PROPOFOL;  Surgeon: Sharyn Creamer, MD;  Location: [facility];  Service: Gastroenterology;  Laterality: N/A;   POLYPECTOMY  08/22/2022   Procedure: POLYPECTOMY;  Surgeon: Sharyn Creamer, MD;  Location: [facility];  Service: Gastroenterology;;    FAMILY HISTORY: Family History  Problem Relation Age of Onset   Allergic rhinitis Mother    Urticaria Mother    Diabetes Maternal Grandmother    Asthma Daughter    Angioedema Neg Hx    Eczema Neg Hx    Immunodeficiency Neg Hx    Stomach cancer Neg Hx    Colon cancer Neg Hx     SOCIAL HISTORY: Social History   Socioeconomic History   Marital status: Single    Spouse name: Not on file   Number of children: Not on file   Years of education: Not on file   Highest education level: Not on file  Occupational History   Not on file  Tobacco Use   Smoking status: Former    Packs/day: 1.50    Years: 42.00    Total pack years: 63.00    Types: Cigarettes    Quit date: 12/22/2015    Years since quitting: 6.8    Passive exposure: Past   Smokeless tobacco: Never  Vaping Use   Vaping Use: Never used  Substance and Sexual Activity   Alcohol use: Yes   Drug use: Yes    Types: Marijuana    Comment: uses every day   Sexual activity: Yes  Other Topics Concern   Not on file  Social History Narrative   Not on file   Social Determinants of Health   Financial Resource Strain: Not on file  Food Insecurity: Not on file  Transportation Needs: Not on file  Physical Activity: Not on file  Stress: Not on file  Social Connections: Not on file  Intimate Partner Violence: Not on file      Marcial Pacas, M.D. Ph.D.  [facility] [address] [place], [place] [address] Ph: [phone] Fax: [phone]  CC:  Ezzard Flax, MD [address] [place],  [place] [address]  Ezzard Flax, MD

## 2022-11-13 LAB — RPR

## 2022-11-18 ENCOUNTER — Ambulatory Visit (INDEPENDENT_AMBULATORY_CARE_PROVIDER_SITE_OTHER): Payer: 59

## 2022-11-18 DIAGNOSIS — L603 Nail dystrophy: Secondary | ICD-10-CM

## 2022-11-18 NOTE — Patient Instructions (Signed)

## 2022-11-18 NOTE — Progress Notes (Signed)
Patient presents today for the 6th laser treatment. Diagnosed with nail dystrophy by Dr. Milinda Pointer.    Toenail most affected bilateral hallux and bilateral 2nd toe.     All other systems are negative.   Nails were filed thin. Laser therapy was administered to 1-5 toenails bilateral and patient tolerated the treatment well. All safety precautions were in place.      Patient has completed the recommended laser treatments. He will follow up with Dr. Milinda Pointer in 3 months to evaluate progress.

## 2022-11-19 LAB — RPR

## 2022-11-20 ENCOUNTER — Other Ambulatory Visit: Payer: 59

## 2022-11-22 LAB — COMPREHENSIVE METABOLIC PANEL
ALT: 21 IU/L (ref 0–32)
AST: 18 IU/L (ref 0–40)
Albumin/Globulin Ratio: 1.8 (ref 1.2–2.2)
Albumin: 4.2 g/dL (ref 3.8–4.9)
Alkaline Phosphatase: 99 IU/L (ref 44–121)
BUN/Creatinine Ratio: 18 (ref 9–23)
BUN: 17 mg/dL (ref 6–24)
Bilirubin Total: 0.3 mg/dL (ref 0.0–1.2)
CO2: 24 mmol/L (ref 20–29)
Calcium: 9.5 mg/dL (ref 8.7–10.2)
Chloride: 101 mmol/L (ref 96–106)
Creatinine, Ser: 0.94 mg/dL (ref 0.57–1.00)
Globulin, Total: 2.4 g/dL (ref 1.5–4.5)
Glucose: 101 mg/dL — ABNORMAL HIGH (ref 70–99)
Potassium: 4.3 mmol/L (ref 3.5–5.2)
Sodium: 141 mmol/L (ref 134–144)
Total Protein: 6.6 g/dL (ref 6.0–8.5)
eGFR: 70 mL/min/{1.73_m2} (ref >59)

## 2022-11-22 LAB — CBC WITH DIFFERENTIAL/PLATELET
Basophils Absolute: 0 10*3/uL (ref 0.0–0.2)
Basos: 0 %
EOS (ABSOLUTE): 0.2 10*3/uL (ref 0.0–0.4)
Eos: 5 %
Hematocrit: 39.3 % (ref 34.0–46.6)
Hemoglobin: 12.1 g/dL (ref 11.1–15.9)
Immature Grans (Abs): 0 10*3/uL (ref 0.0–0.1)
Immature Granulocytes: 0 %
Lymphocytes Absolute: 1.7 10*3/uL (ref 0.7–3.1)
Lymphs: 38 %
MCH: 24.2 pg — ABNORMAL LOW (ref 26.6–33.0)
MCHC: 30.8 g/dL — ABNORMAL LOW (ref 31.5–35.7)
MCV: 78 fL — ABNORMAL LOW (ref 79–97)
Monocytes Absolute: 0.3 10*3/uL (ref 0.1–0.9)
Monocytes: 6 %
Neutrophils Absolute: 2.3 10*3/uL (ref 1.4–7.0)
Neutrophils: 51 %
Platelets: 217 10*3/uL (ref 150–450)
RBC: 5.01 x10E6/uL (ref 3.77–5.28)
RDW: 14.5 % (ref 11.7–15.4)
WBC: 4.6 10*3/uL (ref 3.4–10.8)

## 2022-11-22 LAB — RPR: RPR Ser Ql: REACTIVE — AB

## 2022-11-22 LAB — MULTIPLE MYELOMA PANEL, SERUM
Albumin SerPl Elph-Mcnc: 3.6 g/dL (ref 2.9–4.4)
Albumin/Glob SerPl: 1.3 (ref 0.7–1.7)
Alpha 1: 0.2 g/dL (ref 0.0–0.4)
Alpha2 Glob SerPl Elph-Mcnc: 0.8 g/dL (ref 0.4–1.0)
B-Globulin SerPl Elph-Mcnc: 1.1 g/dL (ref 0.7–1.3)
Gamma Glob SerPl Elph-Mcnc: 0.8 g/dL (ref 0.4–1.8)
Globulin, Total: 3 g/dL (ref 2.2–3.9)
IgA/Immunoglobulin A, Serum: 224 mg/dL (ref 87–352)
IgG (Immunoglobin G), Serum: 1053 mg/dL (ref 586–1602)
IgM (Immunoglobulin M), Srm: 114 mg/dL (ref 26–217)

## 2022-11-22 LAB — TSH: TSH: 1.22 u[IU]/mL (ref 0.450–4.500)

## 2022-11-22 LAB — HGB A1C W/O EAG: Hgb A1c MFr Bld: 6.7 % — ABNORMAL HIGH (ref 4.8–5.6)

## 2022-11-22 LAB — RPR, QUANT+TP ABS (REFLEX)
Rapid Plasma Reagin, Quant: 1:128 {titer} — ABNORMAL HIGH
T Pallidum Abs: NONREACTIVE

## 2022-11-22 LAB — SEDIMENTATION RATE: Sed Rate: 34 mm/hr (ref 0–40)

## 2022-11-22 LAB — VITAMIN D 25 HYDROXY (VIT D DEFICIENCY, FRACTURES): Vit D, 25-Hydroxy: 35.6 ng/mL (ref 30.0–100.0)

## 2022-11-22 LAB — C-REACTIVE PROTEIN: CRP: 3 mg/L (ref 0–10)

## 2022-11-22 LAB — ANA W/REFLEX IF POSITIVE: Anti Nuclear Antibody (ANA): NEGATIVE

## 2022-11-22 LAB — FERRITIN: Ferritin: 166 ng/mL — ABNORMAL HIGH (ref 15–150)

## 2022-11-22 LAB — VITAMIN B12: Vitamin B-12: 418 pg/mL (ref 232–1245)

## 2022-11-26 ENCOUNTER — Other Ambulatory Visit: Payer: Self-pay | Admitting: Neurology

## 2022-11-26 ENCOUNTER — Telehealth: Payer: Self-pay

## 2022-11-26 DIAGNOSIS — A530 Latent syphilis, unspecified as early or late: Secondary | ICD-10-CM

## 2022-11-26 NOTE — Telephone Encounter (Signed)
Called pt to let her know her lab results. 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  Dr. Felecia Shelling said that she needs to have another blood test to see if she actually has the infection.  He went ahead and placed the order for an FTA-ABS and that she can come into the office to have it done or if she lives further away we could also place the order with [facility].  Most of the other tests were normal though her hemoglobin A1c showed borderline diabetes. Pt stated that she will come in today 11/26/2022 to get lab work done. Told pt to come before 5 pm this afternoon. Pt verbalized understanding.

## 2022-11-27 ENCOUNTER — Other Ambulatory Visit: Payer: Self-pay | Admitting: Neurology

## 2022-11-27 DIAGNOSIS — A530 Latent syphilis, unspecified as early or late: Secondary | ICD-10-CM

## 2022-11-28 ENCOUNTER — Other Ambulatory Visit: Payer: Self-pay | Admitting: *Deleted

## 2022-11-28 ENCOUNTER — Other Ambulatory Visit (INDEPENDENT_AMBULATORY_CARE_PROVIDER_SITE_OTHER): Payer: Self-pay

## 2022-11-28 DIAGNOSIS — A530 Latent syphilis, unspecified as early or late: Secondary | ICD-10-CM

## 2022-11-28 DIAGNOSIS — Z0289 Encounter for other administrative examinations: Secondary | ICD-10-CM

## 2022-11-30 LAB — FLUORESCENT TREPONEMAL AB(FTA)-IGG-BLD: Fluorescent Treponemal Ab, IgG: NONREACTIVE

## 2022-12-20 ENCOUNTER — Other Ambulatory Visit: Payer: Self-pay | Admitting: Internal Medicine

## 2022-12-20 DIAGNOSIS — Z1231 Encounter for screening mammogram for malignant neoplasm of breast: Secondary | ICD-10-CM

## 2023-01-08 ENCOUNTER — Ambulatory Visit (HOSPITAL_BASED_OUTPATIENT_CLINIC_OR_DEPARTMENT_OTHER): Payer: 59 | Admitting: Pulmonary Disease

## 2023-01-10 ENCOUNTER — Encounter (HOSPITAL_BASED_OUTPATIENT_CLINIC_OR_DEPARTMENT_OTHER): Payer: Self-pay | Admitting: Pulmonary Disease

## 2023-01-16 ENCOUNTER — Telehealth: Payer: Self-pay | Admitting: Neurology

## 2023-01-16 DIAGNOSIS — R202 Paresthesia of skin: Secondary | ICD-10-CM

## 2023-01-16 NOTE — Telephone Encounter (Signed)
Orders Placed This Encounter  Procedures   NCV with EMG(electromyography)      

## 2023-01-20 NOTE — Telephone Encounter (Signed)
I left a voicemail for the patient to call back and schedule an appointment. If I am unavailable, please schedule patient for 04/30/23 at 10:30 AM. A 10:30 AM slot is preferred for this patient (may need additional time).

## 2023-01-20 NOTE — Telephone Encounter (Signed)
The patient has been scheduled for NCV/EMG. The procedure was explained. The patient is aware to arrive prior to scheduled appointment time @ 10:30 AM. Made aware to shower and to not put on any oils or lotions before appointment.

## 2023-01-24 ENCOUNTER — Ambulatory Visit: Payer: 59

## 2023-01-28 ENCOUNTER — Ambulatory Visit: Payer: 59

## 2023-01-30 ENCOUNTER — Telehealth: Payer: Self-pay | Admitting: Neurology

## 2023-01-30 NOTE — Telephone Encounter (Signed)
LVM and sent mychart msg informing pt of appt change due to provider schedule change 

## 2023-01-31 ENCOUNTER — Ambulatory Visit
Admission: RE | Admit: 2023-01-31 | Discharge: 2023-01-31 | Disposition: A | Discharge status: Home / Self Care | Payer: 59 | Source: Ambulatory Visit | Attending: Internal Medicine | Admitting: Internal Medicine

## 2023-01-31 DIAGNOSIS — Z1231 Encounter for screening mammogram for malignant neoplasm of breast: Secondary | ICD-10-CM

## 2023-02-18 ENCOUNTER — Ambulatory Visit: Payer: 59 | Admitting: Podiatry

## 2023-04-10 ENCOUNTER — Ambulatory Visit: Payer: 59 | Admitting: Podiatry

## 2023-04-22 ENCOUNTER — Ambulatory Visit (HOSPITAL_BASED_OUTPATIENT_CLINIC_OR_DEPARTMENT_OTHER): Payer: 59 | Admitting: Pulmonary Disease

## 2023-04-30 ENCOUNTER — Ambulatory Visit: Payer: 59 | Admitting: Neurology

## 2023-04-30 DIAGNOSIS — R202 Paresthesia of skin: Secondary | ICD-10-CM

## 2023-04-30 DIAGNOSIS — A530 Latent syphilis, unspecified as early or late: Secondary | ICD-10-CM | POA: Diagnosis not present

## 2023-04-30 NOTE — Progress Notes (Signed)
ASSESSMENT AND PLAN  Brianna Sellers is a 57 y.o. female   Peripheral neuropathy Obesity  EMG nerve conduction study confirmed mild axonal sensorimotor polyneuropathy, most related to her diabetes, A1c was 6.7,  Higher dose of gabapentin 300 mg up to 3 tablets as needed  Referral to weight loss program  Positive RPR, repeat laboratory evaluation including treponema  pallidum antibody  DIAGNOSTIC DATA (LABS, IMAGING, TESTING) - I reviewed patient records, labs, notes, testing and imaging myself where available.   MEDICAL HISTORY:  Brianna Sellers, is a 57 year old female seen in request by her primary care [place],  [place] [address], Laren Boom, MD for evaluation of bilateral feet paresthesia, initial evaluation was on November 12, 2022  I reviewed and summarized the referring note. 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, around 2023, she noticed intermittent bilateral toe paresthesia, getting worse after bearing weight, sometimes after walking 10,000 steps, she felt burning sensation at the bottom of her feet, shooting pain, difficult to sleep, but she denied gait abnormality,   Did have a history of bilateral carpal tunnel release surgery,  She also describes low for weight loss,  UPDATE April 30, 2023: She return for electrodiagnostic study today, which showed evidence of mild axonal length-dependent sensorimotor polyneuropathy  Laboratory evaluation for etiology in 2024, showed mild elevation of A1c 6.7, but positive RPR with titer of 1:128, she denies a history of syphilis all risk factor Rest of the laboratory evaluation showed normal or negative ANA, protein electrophoresis, CBC, vitamin D, B12, C-reactive protein, TSH,  PHYSICAL EXAM: 144/81, heart rate of 82  Gen: NAD, conversant, well nourised, well groomed                     Cardiovascular: Regular rate rhythm, no peripheral edema, warm, nontender. Eyes: Conjunctivae clear without  exudates or hemorrhage Neck: Supple, no carotid bruits. Pulmonary: Clear to auscultation bilaterally   NEUROLOGICAL EXAM:  MENTAL STATUS: Speech/cognition: Awake, alert, oriented to history taking and casual conversation, morbid obesity CRANIAL NERVES: CN II: Visual fields are full to confrontation. Pupils are round equal and briskly reactive to light. CN III, IV, VI: extraocular movement are normal. No ptosis. CN V: Facial sensation is intact to light touch CN VII: Face is symmetric with normal eye closure  CN VIII: Hearing is normal to causal conversation. CN IX, X: Phonation is normal. CN XI: Head turning and shoulder shrug are intact  MOTOR: There is no pronator drift of out-stretched arms. Muscle bulk and tone are normal. Muscle strength is normal.  REFLEXES: Reflexes are 2 and symmetric at the biceps, triceps, knees, and trace at ankles. Plantar responses are flexor.  SENSORY: Intact to light touch, pinprick and vibratory sensation are intact in fingers and toes.  COORDINATION: There is no trunk or limb dysmetria noted.  GAIT/STANCE: Posture is normal. Gait is steady with normal steps, obese,  REVIEW OF SYSTEMS:  Full 14 system review of systems performed and notable only for as above All other review of systems were negative.   ALLERGIES: Allergies  Allergen Reactions   Diphenhydramine Hcl Other (See Comments)    Uncontrolled sneezing   Bacitracin     HOME MEDICATIONS: Current Outpatient Medications  Medication Sig Dispense Refill   amLODipine (NORVASC) 10 MG tablet Take 10 mg by mouth daily.     atorvastatin (LIPITOR) 40 MG tablet Take by mouth.     Fluocinolone Acetonide 0.01 % OIL Place  1 drop in ear(s) daily as needed.     gabapentin (NEURONTIN) 300 MG capsule Take 1 capsule (300 mg total) by mouth 3 (three) times daily as needed. 90 capsule 11   losartan (COZAAR) 100 MG tablet Take 100 mg by mouth daily.     metFORMIN (GLUCOPHAGE-XR) 500 MG 24 hr  tablet Take 500 mg by mouth daily.     OZEMPIC, 0.25 OR 0.5 MG/DOSE, 2 MG/3ML SOPN      triamcinolone ointment (KENALOG) 0.1 % Apply 1 Application topically daily as needed.     No current facility-administered medications for this visit.    PAST MEDICAL HISTORY: Past Medical History:  Diagnosis Date   Diabetes mellitus without complication (HCC)    Eczema    Hyperlipidemia    Hypertension     PAST SURGICAL HISTORY: Past Surgical History:  Procedure Laterality Date   APPENDECTOMY     age 13 years old   COLONOSCOPY WITH PROPOFOL N/A 08/22/2022   Procedure: COLONOSCOPY WITH PROPOFOL;  Surgeon: Imogene Burn, MD;  Location: [facility];  Service: Gastroenterology;  Laterality: N/A;   POLYPECTOMY  08/22/2022   Procedure: POLYPECTOMY;  Surgeon: Imogene Burn, MD;  Location: [facility];  Service: Gastroenterology;;    FAMILY HISTORY: Family History  Problem Relation Age of Onset   Allergic rhinitis Mother    Urticaria Mother    Diabetes Maternal Grandmother    Asthma Daughter    Angioedema Neg Hx    Eczema Neg Hx    Immunodeficiency Neg Hx    Stomach cancer Neg Hx    Colon cancer Neg Hx     SOCIAL HISTORY: Social History   Socioeconomic History   Marital status: Single    Spouse name: Not on file   Number of children: Not on file   Years of education: Not on file   Highest education level: Not on file  Occupational History   Not on file  Tobacco Use   Smoking status: Former    Current packs/day: 0.00    Average packs/day: 1.5 packs/day for 42.0 years (63.0 ttl pk-yrs)    Types: Cigarettes    Start date: 12/21/1973    Quit date: 12/22/2015    Years since quitting: 7.3    Passive exposure: Past   Smokeless tobacco: Never  Vaping Use   Vaping status: Never Used  Substance and Sexual Activity   Alcohol use: Yes   Drug use: Yes    Types: Marijuana    Comment: uses every day   Sexual activity: Yes  Other Topics Concern   Not on file  Social History  Narrative   Not on file   Social Determinants of Health   Financial Resource Strain: Not on file  Food Insecurity: Not on file  Transportation Needs: Not on file  Physical Activity: Not on file  Stress: No Stress Concern Present (06/05/2022)   Received from [facility], [facility]   [employer] [facility] - Occupational Stress Questionnaire    Feeling of Stress : Not at all  Social Connections: Unknown (01/03/2022)   Received from [facility], [facility]   Social Network    Social Network: Not on file  Intimate Partner Violence: Unknown (12/05/2021)   Received from [facility], [facility]   HITS    Physically Hurt: Not on file    Insult or Talk Down To: Not on file    Threaten Physical Harm: Not on file    Scream or Curse:  Not on file      Levert Feinstein, M.D. Ph.D.  [facility] [address], [address] [place], [place] [address] Ph: [phone] Fax: [phone]  CC:  Laren Boom, MD [address] [place],  [place] [address]  Laren Boom, MD

## 2023-05-01 NOTE — Procedures (Signed)
Full Name: Brianna Sellers Gender: Female MRN #: [id number] Date of Birth: 07/10/1966    Visit Date: 04/30/2023 12:32 Age: 57 Years Examining Physician: Dr. Levert Feinstein Referring Physician: Dr. Levert Feinstein Height: 5 feet 7 inch History: 57 year old female complains of intermittent bilateral toes paresthesia  Summary of the test:  Nerve conduction study: Bilateral sural, superficial peroneal sensory responses showed normal peak latency with mild to moderately decreased snap amplitude.  Right median, ulnar sensory responses were normal.  Right median and ulnar mixed responses showed no significant difference  Left tibial motor responses were normal.  Right tibial motor response showed moderately decreased CMAP amplitude, suboptimal stimulation at proximal stimulation site  Bilateral peroneal to EDB motor responses showed no significant abnormality.  Right median, ulnar sensory responses were normal.  Electromyography: Selected needle examinations at right lower extremity muscles and right lumbosacral paraspinal muscles showed no significant abnormality..  Conclusion: This is an abnormal study.  There is electrodiagnostic evidence of mild length-dependent axonal sensorimotor polyneuropathy.  There is no evidence of right lumbar radiculopathy.    ------------------------------- Levert Feinstein, M.D. Ph.D.  [facility] [address], [address] [place], [place] [address] Tel: [phone] Fax: [phone]  Verbal informed consent was obtained from the patient, patient was informed of potential risk of procedure, including bruising, bleeding, hematoma formation, infection, muscle weakness, muscle pain, numbness, among others.        MNC    Nerve / Sites Muscle Latency Ref. Amplitude Ref. Rel Amp Segments Distance Velocity Ref. Area    ms ms mV mV %  cm m/s m/s mVms  R Median - APB     Wrist APB 3.2 ?4.4 9.8 ?4.0 100 Wrist - APB 7   22.1     Upper arm APB 7.6  8.5   87.3 Upper arm - Wrist 27 61 ?49 25.6  R Ulnar - ADM     Wrist ADM 2.3 ?3.3 12.6 ?6.0 100 Wrist - ADM 7   33.5     B.Elbow ADM 4.8  12.3  97.7 B.Elbow - Wrist 14.6 57 ?49 32.9     A.Elbow ADM 7.6  11.1  90.1 A.Elbow - B.Elbow 13.6 50 ?49 31.9  R Peroneal - EDB     Ankle EDB 4.1 ?6.5 3.3 ?2.0 100 Ankle - EDB 9   8.6     Fib head EDB 10.2  2.9  87.5 Fib head - Ankle 27 44 ?44 9.0     Pop fossa EDB 12.0  2.7  93.4 Pop fossa - Fib head 9 50 ?44 8.8         Pop fossa - Ankle      L Peroneal - EDB     Ankle EDB 3.9 ?6.5 1.9 ?2.0 100 Ankle - EDB 9   4.4     Fib head EDB 10.3  2.3  120 Fib head - Ankle 29.6 47 ?44 5.9     Pop fossa EDB 12.0  2.2  96.7 Pop fossa - Fib head 9 51 ?44 5.8         Pop fossa - Ankle      R Tibial - AH     Ankle AH 4.3 ?5.8 2.6 ?4.0 100 Ankle - AH 9   4.3     Pop fossa AH 10.2  0.5  20 Pop fossa - Ankle 36 61 ?41 1.1  L Tibial - AH     Ankle AH 3.7 ?5.8 5.0 ?4.0  100 Ankle - AH 9   9.9     Pop fossa AH 11.7  4.0  80.6 Pop fossa - Ankle 36 45 ?41 9.7                 SNC    Nerve / Sites Rec. Site Peak Lat Ref.  Amp Ref. Segments Distance Peak Diff Ref.    ms ms V V  cm ms ms  R Sural - Ankle (Calf)     Calf Ankle 3.3 ?4.4 4 ?6 Calf - Ankle 14    L Sural - Ankle (Calf)     Calf Ankle 3.4 ?4.4 4 ?6 Calf - Ankle 14    R Superficial peroneal - Ankle     Lat leg Ankle 3.7 ?4.4 4 ?6 Lat leg - Ankle 14    L Superficial peroneal - Ankle     Lat leg Ankle 3.5 ?4.4 3 ?6 Lat leg - Ankle 14    R Median, Ulnar - Transcarpal comparison     Median Palm Wrist 2.1 ?2.2 45 ?35 Median Palm - Wrist 8       Ulnar Palm Wrist 2.0 ?2.2 8 ?12 Ulnar Palm - Wrist 8          Median Palm - Ulnar Palm  0.1 ?0.4  R Median - Orthodromic (Dig II, Mid palm)     Dig II Wrist 2.9 ?3.4 12 ?10 Dig II - Wrist 13    R Ulnar - Orthodromic, (Dig V, Mid palm)     Dig V Wrist 2.9 ?3.1 7 ?5 Dig V - Wrist 71                     F  Wave    Nerve F Lat Ref.   ms ms  R Tibial - AH 50.8 ?56.0  L  Tibial - AH 49.0 ?56.0  R Ulnar - ADM 28.3 ?32.0           EMG Summary Table    Spontaneous MUAP Recruitment  Muscle IA Fib PSW Fasc Other Amp Dur. Poly Pattern  R. Tibialis anterior Normal None None None _______ Normal Normal Normal Normal  R. Tibialis posterior Normal None None None _______ Normal Normal Normal Normal  R. Peroneus longus Normal None None None _______ Normal Normal Normal Normal  R. Gastrocnemius (Medial head) Normal None None None _______ Normal Normal Normal Normal  R. Vastus lateralis Normal None None None _______ Normal Normal Normal Normal  R. Lumbar paraspinals (low) Normal None None None _______ Normal Normal Normal Normal  R. Lumbar paraspinals (mid) Normal None None None _______ Normal Normal Normal Normal

## 2023-05-02 LAB — HIV ANTIBODY (ROUTINE TESTING W REFLEX): HIV Screen 4th Generation wRfx: NONREACTIVE

## 2023-05-02 LAB — T.PALLIDUM AB, TOTAL: Treponema pallidum Antibodies: NONREACTIVE

## 2023-05-18 IMAGING — MG MM DIGITAL SCREENING BILAT W/ TOMO AND CAD
8 of 15 series · 8 of 40 positions shown · non-contrast
Comparison: Previous exam(s).

CLINICAL DATA: Screening.

EXAM:
DIGITAL SCREENING BILATERAL MAMMOGRAM WITH TOMOSYNTHESIS AND CAD
TECHNIQUE: Bilateral screening digital craniocaudal and mediolateral oblique
mammograms were obtained. Bilateral screening digital breast
tomosynthesis was performed. The images were evaluated with
computer-aided detection.

[R MLO synth-2D (1 of 2)]
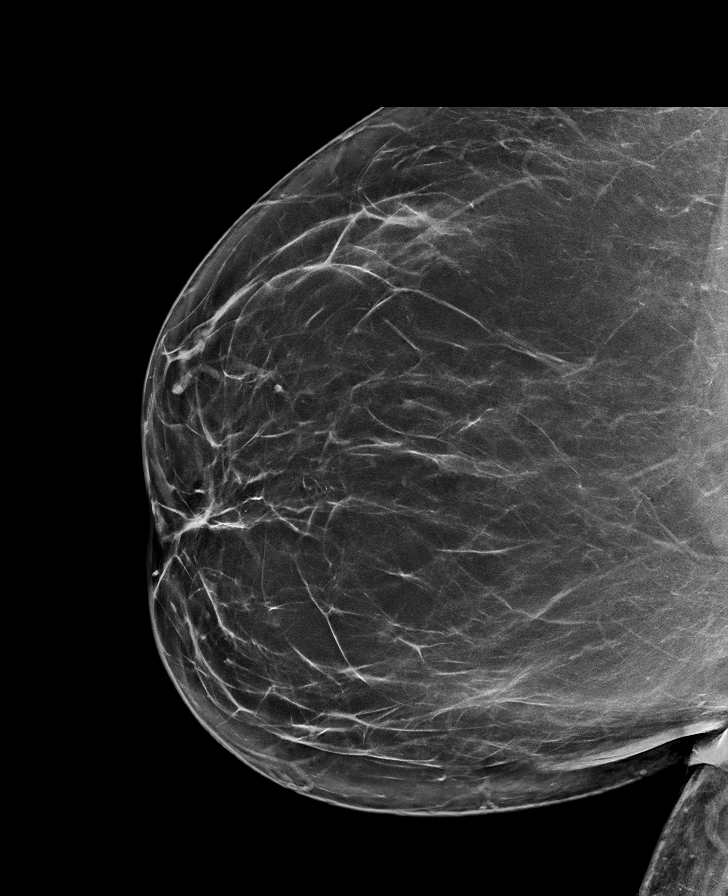

[R MLO synth-2D (2 of 2)]
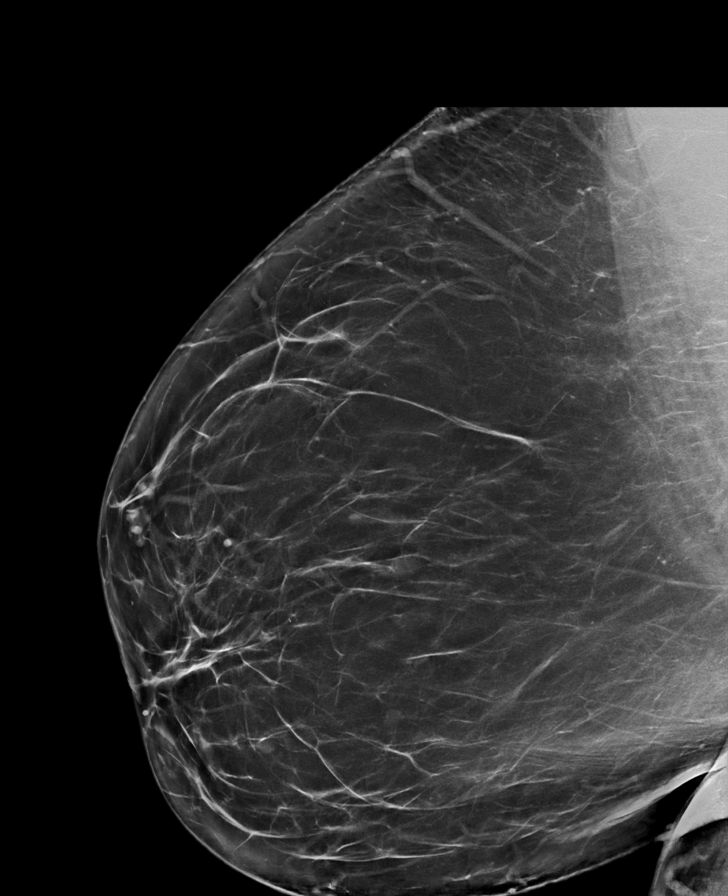

[R CC synth-2D (1 of 2)]
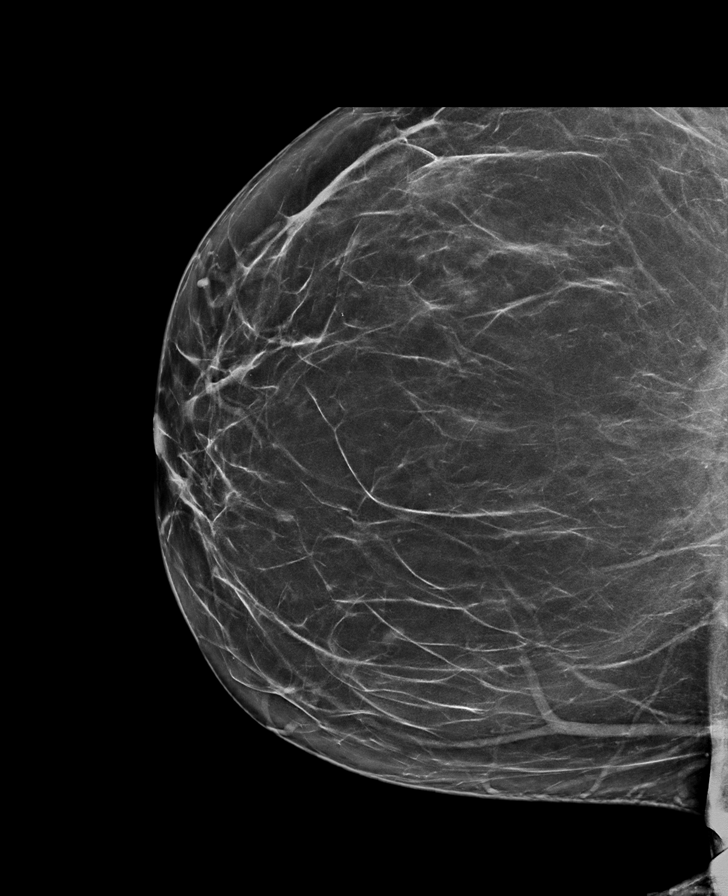

[R CC synth-2D (2 of 2)]
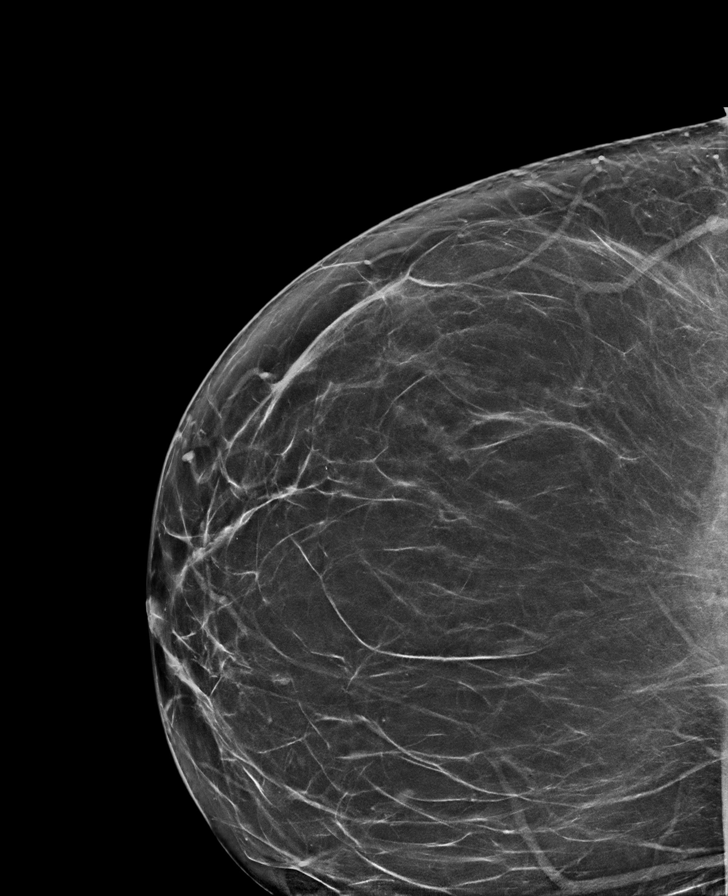

[L CC synth-2D (1 of 2)]
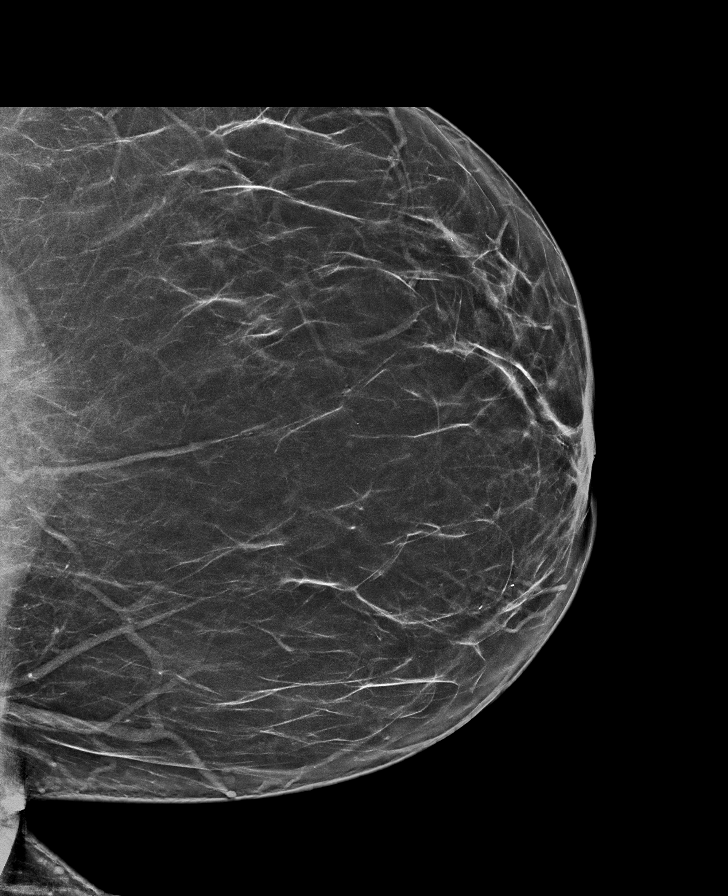

[L MLO synth-2D]
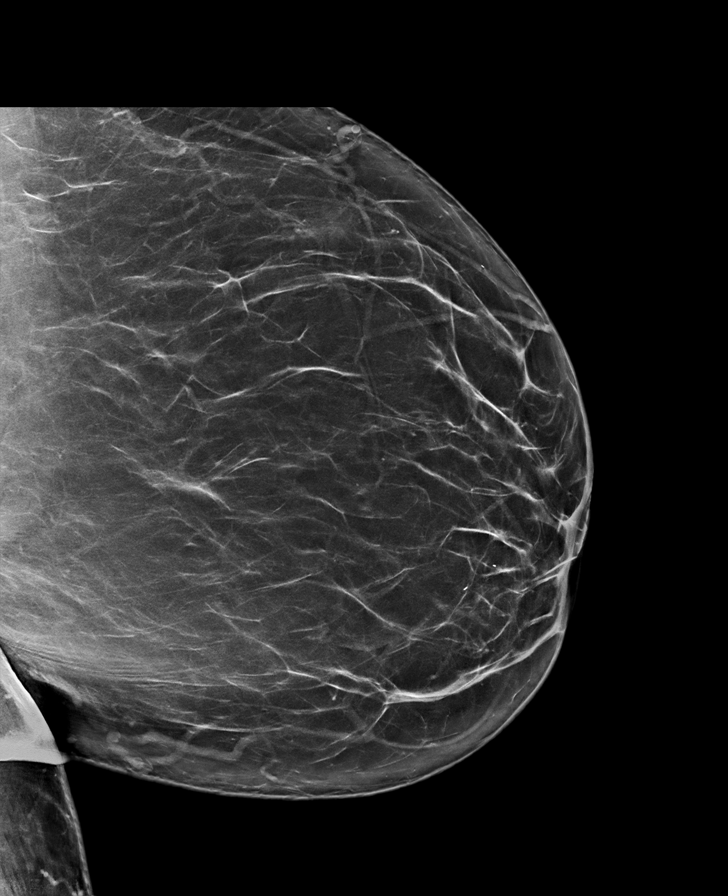

[L CC synth-2D (2 of 2)]
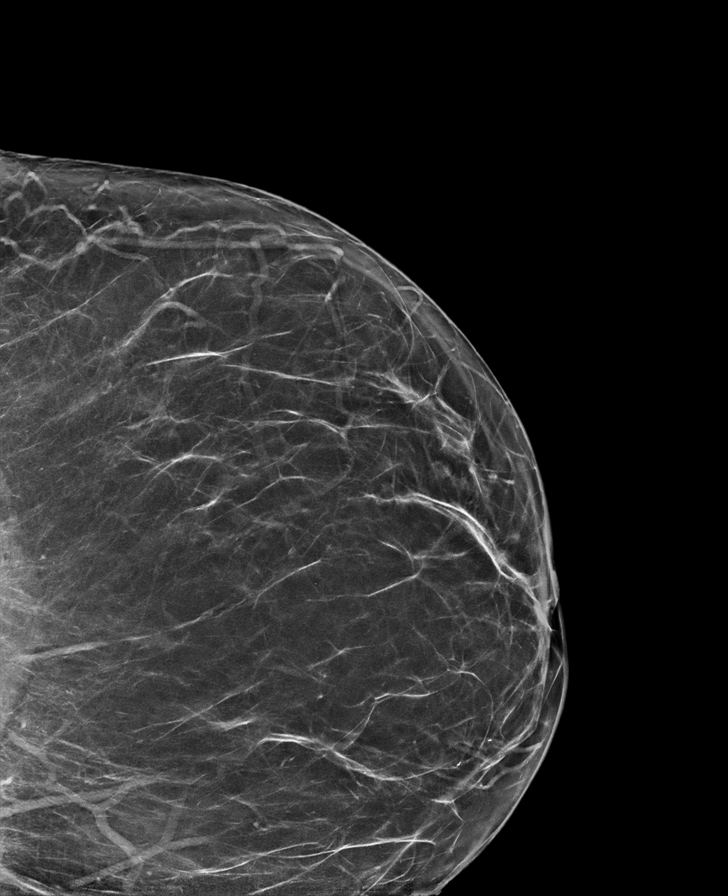

[R MLO tomo · tomo slice 58/85.0]
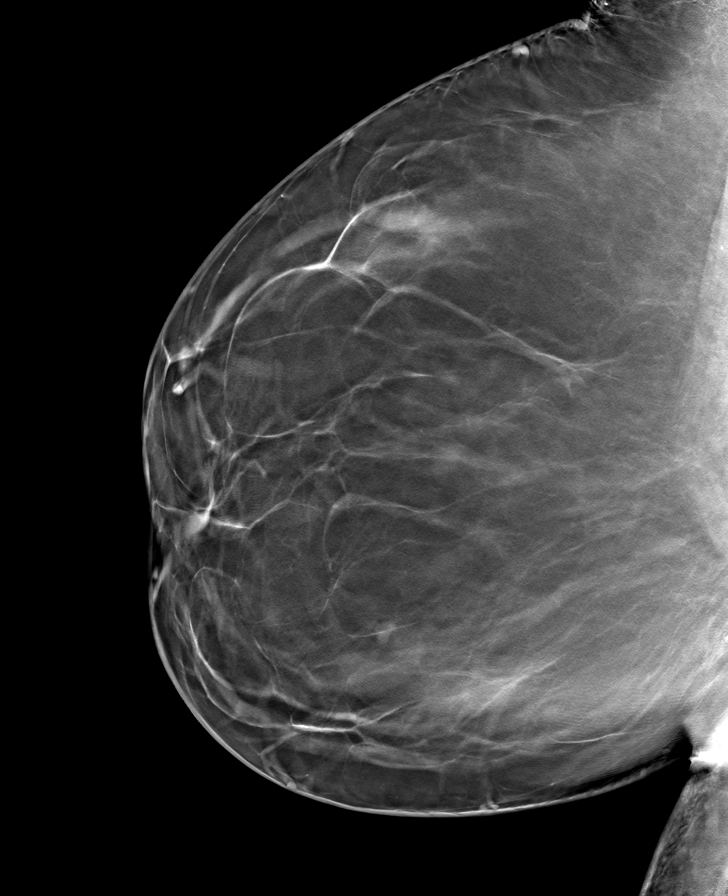

[8 of 40 positions shown; findings below may reference images not displayed]

ACR Breast Density Category b: There are scattered areas of
fibroglandular density.
FINDINGS: There are no findings suspicious for malignancy.
IMPRESSION: No mammographic evidence of malignancy. A result letter of this
screening mammogram will be mailed directly to the patient.

RECOMMENDATION:
Screening mammogram in one year. (Code:51-O-LD2)

BI-RADS CATEGORY  1: Negative.

## 2023-05-27 ENCOUNTER — Telehealth: Payer: Self-pay | Admitting: Neurology

## 2023-05-27 DIAGNOSIS — A530 Latent syphilis, unspecified as early or late: Secondary | ICD-10-CM

## 2023-05-27 DIAGNOSIS — R202 Paresthesia of skin: Secondary | ICD-10-CM

## 2023-05-27 NOTE — Telephone Encounter (Signed)
Pt asking to speak with Dr. Terrace Arabia about Nerve Conduction results. Would like a call back to discuss probably scheduling an appt to go over results.

## 2023-05-28 NOTE — Telephone Encounter (Signed)
I spoke with the patient. She said that she did not have a question about the nerve conduction study. She had a question about lab results from the labs drawn on 04/30/23. When I asked for specifics regarding the question she said some things "did not add up". She is requesting a call back from Dr. Terrace Arabia.

## 2023-05-30 DIAGNOSIS — A530 Latent syphilis, unspecified as early or late: Secondary | ICD-10-CM | POA: Insufficient documentation

## 2023-05-30 NOTE — Telephone Encounter (Signed)
I called patient, explained her laboratory findings, even though she has initial positive RPR, but later no specific laboratory evaluation including fluorescent treponemal antibody, was negative, there is no evidence of syphilis  Patient complains of intermittent rash broke out in her back,  Wants to be seen by infectious disease specialist,  Orders Placed This Encounter  Procedures   Ambulatory referral to Infectious Disease

## 2023-06-05 ENCOUNTER — Other Ambulatory Visit: Payer: Self-pay

## 2023-06-05 ENCOUNTER — Encounter: Payer: Self-pay | Admitting: Internal Medicine

## 2023-06-05 ENCOUNTER — Ambulatory Visit (INDEPENDENT_AMBULATORY_CARE_PROVIDER_SITE_OTHER): Payer: 59 | Admitting: Internal Medicine

## 2023-06-05 VITALS — BP 133/83 | HR 78 | Resp 16 | Ht 67.0 in | Wt 330.0 lb

## 2023-06-05 DIAGNOSIS — R899 Unspecified abnormal finding in specimens from other organs, systems and tissues: Secondary | ICD-10-CM | POA: Diagnosis not present

## 2023-06-05 DIAGNOSIS — B029 Zoster without complications: Secondary | ICD-10-CM | POA: Diagnosis not present

## 2023-06-05 NOTE — Patient Instructions (Signed)
This is likely false positive syphilis test. We'll repeat another set today and if the specific treponemal test is negative it will confirm false positive test. And you won't hear from [place] -- no news is good news  With regard to the right lower back recurrent rash, this is shingle/zoster  Discuss with your pcp about getting the shingrix vaccine to reduce risk of neuropathic pain. If you have recurrent shingle more than 2-3 times a year consider going on a daily once a day valtrex for prophylaxis.   No need to follow up with me  thanks

## 2023-06-05 NOTE — Progress Notes (Signed)
[facility]  Reason for Consult:positive rpr titer Referring Provider: Levert Feinstein ([facility])    Patient Active Problem List   Diagnosis Date Noted   Positive RPR test 05/30/2023   Paresthesia 11/12/2022   Right carpal tunnel syndrome 04/09/2022   Arthritis of carpometacarpal (CMC) joint of right thumb 01/31/2021   Carpal tunnel syndrome of left wrist 01/31/2021   Cubital tunnel syndrome on left 01/31/2021   Gastroesophageal reflux disease 09/03/2017   Hyperparathyroidism (HCC) 09/03/2017   Mixed hyperlipidemia 09/03/2017   Obstructive sleep apnea syndrome 09/03/2017   Vitamin D deficiency 09/03/2017   Presbyopia 11/28/2016   Myopia of both eyes 11/27/2016   Regular astigmatism of both eyes 11/27/2016   Screening for eye condition 11/27/2016   ESR raised 11/25/2016   Headache disorder 11/25/2016   Otitis external 11/25/2016   Referred otalgia 11/25/2016   Diabetes mellitus, type 2 (HCC) 12/20/2013   HTN (hypertension) 12/20/2013   Knee pain, right 12/20/2013   Pedal edema 12/20/2013   Arthritis involving multiple sites 02/14/2011   Dyspepsia 02/14/2011   Insomnia 02/14/2011   Morbid obesity (HCC) 02/14/2011   Rhinitis 02/14/2011   Tobacco abuse 02/14/2011      HPI: Brianna Sellers is a 57 y.o. 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 The latter was positive x1 in 11/2022 but concomittant treponemal testing x3 have been negative in the span of 5 months; last negative 04/2023  She had ANA testing that was negative as well 11/2022 along with normal tsh and negative spep screen.   She denies risk factors for syphilis: Not married Not sexually active -- last sexually active > 10 years ago   She is never  officially diagnosed with any autoimmune disease  She has had covid vaccination. Her last booster end of 08/2022   She reports a recurrent lower back itchy/tender bump/cyst not blister (and comes in group) rash that would occur twice a year. Doesn't know/think that it is associated with meds (wasn't taking any med when it first started). This has been ongoing for 10-12 years. It has never been biopsied; by the time she would have a dermatology appointment it would be gone. Bloody/mucoid when ruptured and healed. She wouldn't have any other sx when the bumps occur. I showed her picture of shingles and she said that looks like that. The group of bumps occur in the same area each time right lower back. It would itch before it comes on  She is not on immunosuppressant  Patient never had lyme before. She has been in lyme area before but in her 10s. She have family in [place], [place] and has been there a few year ago  No hx lupus sx     Review of Systems: ROS  All other ros negative      Past Medical History:  Diagnosis Date   Diabetes mellitus without complication (HCC)    Eczema    Hyperlipidemia    Hypertension     Social History   Tobacco Use   Smoking status: Former    Current packs/day: 0.00    Average packs/day: 1.5 packs/day for 42.0 years (63.0 ttl pk-yrs)    Types: Cigarettes    Start date: 12/21/1973    Quit date:  12/22/2015    Years since quitting: 7.4    Passive exposure: Past   Smokeless tobacco: Never  Vaping Use   Vaping status: Never Used  Substance Use Topics   Alcohol use: Yes   Drug use: Yes    Types: Marijuana    Comment: uses every day    Family History  Problem Relation Age of Onset   Allergic rhinitis Mother    Urticaria Mother    Diabetes Maternal Grandmother    Asthma Daughter    Angioedema Neg Hx    Eczema Neg Hx    Immunodeficiency Neg Hx    Stomach cancer Neg Hx    Colon cancer Neg Hx     Allergies  Allergen Reactions    Diphenhydramine Hcl Other (See Comments)    Uncontrolled sneezing   Bacitracin     OBJECTIVE: Vitals:   06/05/23 1414  BP: 133/83  Pulse: 78  Resp: 16  SpO2: 98%  Weight: (!) 330 lb (149.7 kg)  Height: 5\' 7"  (1.702 m)   There is no height or weight on file to calculate BMI.   Physical Exam General/constitutional: no distress, pleasant HEENT: Normocephalic, PER, Conj Clear, EOMI, Oropharynx clear Neck supple CV: rrr no mrg Lungs: clear to auscultation, normal respiratory effort Abd: Soft, Nontender Ext: no edema Skin: No Rash Neuro: nonfocal MSK: no peripheral joint swelling/tenderness/warmth; back spines nontender   Lab: Lab Results  Component Value Date   WBC 4.6 11/12/2022   HGB 12.1 11/12/2022   HCT 39.3 11/12/2022   MCV 78 (L) 11/12/2022   PLT 217 11/12/2022   Last metabolic panel Lab Results  Component Value Date   GLUCOSE 101 (H) 11/12/2022   NA 141 11/12/2022   K 4.3 11/12/2022   CL 101 11/12/2022   CO2 24 11/12/2022   BUN 17 11/12/2022   CREATININE 0.94 11/12/2022   EGFR 70 11/12/2022   CALCIUM 9.5 11/12/2022   PROT 6.6 11/12/2022   ALBUMIN 4.2 11/12/2022   LABGLOB 3.0 11/12/2022   LABGLOB 2.4 11/12/2022   AGRATIO 1.8 11/12/2022   BILITOT 0.3 11/12/2022   ALKPHOS 99 11/12/2022   AST 18 11/12/2022   ALT 21 11/12/2022    Microbiology:  Serology: 11/12/22 rpr 1:128 but nonreactive tppa 11/28/22 fta nonreactive 04/30/23 tppa nonreactive; hiv nonreactive  Imaging:   Assessment/plan: Problem List Items Addressed This Visit   None Visit Diagnoses     Abnormal laboratory test    -  Primary   Relevant Orders   T.pallidum Ab, Total   RPR   Herpes zoster without complication             #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 And if tppa is negative wouldn't do further testing   #recurrent rash Sounds like zoster  -discuss with her to f/u pcp for shingrix to reduce neuropathic complication -consider daily valtrex if recurrent, for prophylaxis   No need to follow up. I'll call her back if syphilis testing is positive     Follow-up: No follow-ups on file.  Raymondo Band, MD [facility] 06/05/2023, 2:06 PM

## 2023-06-05 NOTE — Addendum Note (Signed)
Addended by: Harley Alto on: 06/05/2023 04:19 PM   Modules accepted: Orders

## 2023-06-10 LAB — SYPHILIS ANTIBODY CASCADING REFLEX: T. PALLIDUM AB: NEGATIVE

## 2023-07-16 ENCOUNTER — Encounter (HOSPITAL_BASED_OUTPATIENT_CLINIC_OR_DEPARTMENT_OTHER): Payer: Self-pay | Admitting: Pulmonary Disease

## 2023-07-16 ENCOUNTER — Ambulatory Visit (HOSPITAL_BASED_OUTPATIENT_CLINIC_OR_DEPARTMENT_OTHER): Payer: 59 | Admitting: Pulmonary Disease

## 2023-10-06 ENCOUNTER — Other Ambulatory Visit: Payer: Self-pay

## 2023-10-06 ENCOUNTER — Ambulatory Visit
Admission: RE | Admit: 2023-10-06 | Discharge: 2023-10-06 | Disposition: A | Discharge status: Home / Self Care | Payer: 59 | Source: Ambulatory Visit | Attending: Family Medicine | Admitting: Family Medicine

## 2023-10-06 VITALS — BP 107/61 | HR 73 | Temp 98.5°F | Resp 18

## 2023-10-06 DIAGNOSIS — U071 COVID-19: Secondary | ICD-10-CM | POA: Diagnosis not present

## 2023-10-06 DIAGNOSIS — R051 Acute cough: Secondary | ICD-10-CM | POA: Diagnosis not present

## 2023-10-06 LAB — POC COVID19/FLU A&B COMBO
Covid Antigen, POC: POSITIVE — AB
Influenza A Antigen, POC: NEGATIVE
Influenza B Antigen, POC: NEGATIVE

## 2023-10-06 MED ORDER — PAXLOVID (300/100) 20 X 150 MG & 10 X 100MG PO TBPK: 3.0000 | ORAL_TABLET | Freq: Two times a day (BID) | ORAL | 0 refills | Status: AC

## 2023-10-06 NOTE — ED Provider Notes (Signed)
[facility]    CSN: [id number] Arrival date & time: 10/06/23  1821      History   Chief Complaint Chief Complaint  Patient presents with   Cough    Entered by patient    HPI Brianna Sellers is a 58 y.o. female.    Cough Sick for 3 days symptoms include fatigue, subjective fever, rhinorrhea, nasal congestion, cough.  Denies vomiting, diarrhea, abdominal pain, dizziness or lightheadedness.  Nuys known contacts with illness.  Works from home.  Past Medical History:  Diagnosis Date   Diabetes mellitus without complication (HCC)    Eczema    Hyperlipidemia    Hypertension     Patient Active Problem List   Diagnosis Date Noted   Positive RPR test 05/30/2023   Paresthesia 11/12/2022   Right carpal tunnel syndrome 04/09/2022   Arthritis of carpometacarpal [facility]) joint of right thumb 01/31/2021   Carpal tunnel syndrome of left wrist 01/31/2021   Cubital tunnel syndrome on left 01/31/2021   Gastroesophageal reflux disease 09/03/2017   Hyperparathyroidism (HCC) 09/03/2017   Mixed hyperlipidemia 09/03/2017   Obstructive sleep apnea syndrome 09/03/2017   Vitamin D deficiency 09/03/2017   Presbyopia 11/28/2016   Myopia of both eyes 11/27/2016   Regular astigmatism of both eyes 11/27/2016   Screening for eye condition 11/27/2016   ESR raised 11/25/2016   Headache disorder 11/25/2016   Otitis external 11/25/2016   Referred otalgia 11/25/2016   Diabetes mellitus, type 2 (HCC) 12/20/2013   HTN (hypertension) 12/20/2013   Knee pain, right 12/20/2013   Pedal edema 12/20/2013   Arthritis involving multiple sites 02/14/2011   Dyspepsia 02/14/2011   Insomnia 02/14/2011   Morbid obesity (HCC) 02/14/2011   Rhinitis 02/14/2011   Tobacco abuse 02/14/2011    Past Surgical History:  Procedure Laterality Date   APPENDECTOMY     age 65 years old   COLONOSCOPY WITH PROPOFOL N/A 08/22/2022   Procedure: COLONOSCOPY WITH PROPOFOL;  Surgeon: Imogene Burn, MD;  Location: [facility];  Service: Gastroenterology;  Laterality: N/A;   POLYPECTOMY  08/22/2022   Procedure: POLYPECTOMY;  Surgeon: Imogene Burn, MD;  Location: [facility]OS[facility];  Service: Gastroenterology;;    OB History   No obstetric history on file.      Home Medications    Prior to Admission medications   Medication Sig Start Date End Date Taking? Authorizing Provider  amLODipine (NORVASC) 10 MG tablet Take 10 mg by mouth daily. 09/21/19   [provider]  atorvastatin (LIPITOR) 40 MG tablet Take by mouth. 12/07/20   [provider]  Fluocinolone Acetonide 0.01 % OIL Place 1 drop in ear(s) daily as needed. 03/18/22   [provider]  gabapentin (NEURONTIN) 300 MG capsule Take 1 capsule (300 mg total) by mouth 3 (three) times daily as needed. Patient not taking: Reported on 10/06/2023 11/12/22   Levert Feinstein, MD  losartan (COZAAR) 100 MG tablet Take 100 mg by mouth daily. Patient not taking: Reported on 06/05/2023    [provider]  metFORMIN (GLUCOPHAGE-XR) 500 MG 24 hr tablet Take 500 mg by mouth daily. 09/14/21   [provider]  OZEMPIC, 0.25 OR 0.5 MG/DOSE, 2 MG/3ML SOPN     [provider]  triamcinolone ointment (KENALOG) 0.1 % Apply 1 Application topically daily as needed.    [provider]    Family History Family History  Problem Relation Age of Onset   Allergic rhinitis Mother    Urticaria Mother  Diabetes Maternal Grandmother    Asthma Daughter    Angioedema Neg Hx    Eczema Neg Hx    Immunodeficiency Neg Hx    Stomach cancer Neg Hx    Colon cancer Neg Hx     Social History Social History   Tobacco Use   Smoking status: Former    Current packs/day: 0.00    Average packs/day: 1.5 packs/day for 42.0 years (63.0 ttl pk-yrs)    Types: Cigarettes    Start date: 12/21/1973    Quit date: 12/22/2015    Years since quitting: 7.7    Passive exposure: Past   Smokeless tobacco: Never  Vaping Use   Vaping status: Never  Used  Substance Use Topics   Alcohol use: Yes   Drug use: Yes    Types: Marijuana    Comment: uses every day     Allergies   Diphenhydramine hcl and Bacitracin   Review of Systems Review of Systems  Respiratory:  Positive for cough.      Physical Exam Triage Vital Signs ED Triage Vitals  Encounter Vitals Group     BP 10/06/23 1859 107/61     Systolic BP Percentile --      Diastolic BP Percentile --      Pulse Rate 10/06/23 1859 73     Resp 10/06/23 1859 18     Temp 10/06/23 1859 98.5 F (36.9 C)     Temp Source 10/06/23 1859 Oral     SpO2 10/06/23 1859 94 %     Weight --      Height --      Head Circumference --      Peak Flow --      Pain Score 10/06/23 1914 3     Pain Loc --      Pain Education --      Exclude from Growth Chart --    No data found.  Updated Vital Signs BP 107/61 (BP Location: Right Arm)   Pulse 73   Temp 98.5 F (36.9 C) (Oral)   Resp 18   SpO2 94%   Visual Acuity Right Eye Distance:   Left Eye Distance:   Bilateral Distance:    Right Eye Near:   Left Eye Near:    Bilateral Near:     Physical Exam Vitals and nursing note reviewed.  Constitutional:      Appearance: She is obese. She is not ill-appearing or toxic-appearing.  HENT:     Head: Normocephalic and atraumatic.     Right Ear: Tympanic membrane and ear canal normal.     Left Ear: Tympanic membrane and ear canal normal.     Mouth/Throat:     Pharynx: Oropharynx is clear. No posterior oropharyngeal erythema.  Eyes:     Conjunctiva/sclera: Conjunctivae normal.  Cardiovascular:     Rate and Rhythm: Normal rate and regular rhythm.     Heart sounds: Normal heart sounds.  Pulmonary:     Effort: Pulmonary effort is normal.     Breath sounds: Normal breath sounds.  Musculoskeletal:     Cervical back: Neck supple.  Lymphadenopathy:     Cervical: No cervical adenopathy.  Neurological:     Mental Status: She is alert and oriented to person, place, and time.  Psychiatric:         Mood and Affect: Mood normal.      UC Treatments / Results  Labs (all labs ordered are listed, but only abnormal results are displayed) Labs  Reviewed  POC COVID19/FLU A&B COMBO - Abnormal; Notable for the following components:      Result Value   Covid Antigen, POC Positive (*)    All other components within normal limits    EKG   Radiology No results found.  Procedures Procedures (including critical care time)  Medications Ordered in UC Medications - No data to display  Initial Impression / Assessment and Plan / UC Course  I have reviewed the triage vital signs and the nursing notes.  Pertinent labs & imaging results that were available during my care of the patient were reviewed by me and considered in my medical decision making (see chart for details).     58 year old with flulike symptoms for 2 days, well-appearing, nontoxic point-of-care flu negative point-of-care COVID-positive Final Clinical Impressions(s) / UC Diagnoses   Final diagnoses:  Acute cough  COVID-19   Discharge Instructions   None    ED Prescriptions   None    PDMP not reviewed this encounter.   Meliton Rattan, [place] 10/06/23 1950

## 2023-10-06 NOTE — ED Triage Notes (Signed)
Cough, congestions, sneezing, general aches and back pain.  Symptoms started Friday.  Basically slept all day.  Patient did continue to drink liquids.  Has had fruits and vegetables.  Today did eat soup.  Patient is feeling better today, but not normal and wants to make sure she does not have anything bad.

## 2023-10-06 NOTE — Discharge Instructions (Addendum)
To the ED for severe symptoms

## 2023-11-12 ENCOUNTER — Encounter (INDEPENDENT_AMBULATORY_CARE_PROVIDER_SITE_OTHER): Payer: Self-pay

## 2023-11-17 ENCOUNTER — Encounter: Payer: Self-pay | Admitting: Pulmonary Disease

## 2024-01-01 ENCOUNTER — Other Ambulatory Visit: Payer: Self-pay | Admitting: Family Medicine

## 2024-01-01 DIAGNOSIS — Z1231 Encounter for screening mammogram for malignant neoplasm of breast: Secondary | ICD-10-CM

## 2024-02-04 ENCOUNTER — Ambulatory Visit
Admission: RE | Admit: 2024-02-04 | Discharge: 2024-02-04 | Disposition: A | Discharge status: Home / Self Care | Source: Ambulatory Visit | Attending: Family Medicine | Admitting: Family Medicine

## 2024-02-04 DIAGNOSIS — Z1231 Encounter for screening mammogram for malignant neoplasm of breast: Secondary | ICD-10-CM

## 2024-05-18 NOTE — Progress Notes (Signed)
 58 y.o. No obstetric history on file. female here for annual exam. Single. PCP: Kandis Tawni BROCKS, MD   No LMP recorded. (Menstrual status: Perimenopausal).    She reports ***. Urine sample provided: ***  Abnormal bleeding: *** Pelvic discharge or pain: *** Breast mass, nipple discharge or skin changes : ***  Sexually active: *** Birth control: *** Last PAP: No results found for: DIAGPAP, HPVHIGH, ADEQPAP Last mammogram: 02/04/24 density b, bi-rads 1 neg Last colonoscopy: 08/22/22 10 yr recall  Exercising: *** Smoker: ***  Flowsheet Row Office Visit from 06/05/2023 in [facility] [place]  PHQ-2 Total Score 0       GYN HISTORY: ***  OB History  No obstetric history on file.   Past Medical History:  Diagnosis Date   Diabetes mellitus without complication (HCC)    Eczema    Hyperlipidemia    Hypertension    Past Surgical History:  Procedure Laterality Date   APPENDECTOMY     age 11 years old   COLONOSCOPY WITH PROPOFOL  N/A 08/22/2022   Procedure: COLONOSCOPY WITH PROPOFOL ;  Surgeon: Federico Rosario BROCKS, MD;  Location: [facility] ENDOSCOPY;  Service: Gastroenterology;  Laterality: N/A;   POLYPECTOMY  08/22/2022   Procedure: POLYPECTOMY;  Surgeon: Federico Rosario BROCKS, MD;  Location: [facility] ENDOSCOPY;  Service: Gastroenterology;;   Current Outpatient Medications on File Prior to Visit  Medication Sig Dispense Refill   amLODipine (NORVASC) 10 MG tablet Take 10 mg by mouth daily.     atorvastatin (LIPITOR) 40 MG tablet Take by mouth.     Fluocinolone Acetonide 0.01 % OIL Place 1 drop in ear(s) daily as needed.     gabapentin  (NEURONTIN ) 300 MG capsule Take 1 capsule (300 mg total) by mouth 3 (three) times daily as needed. (Patient not taking: Reported on 10/06/2023) 90 capsule 11   losartan (COZAAR) 100 MG tablet Take 100 mg by mouth daily. (Patient not taking: Reported on 06/05/2023)     metFORMIN (GLUCOPHAGE-XR) 500 MG 24 hr  tablet Take 500 mg by mouth daily.     OZEMPIC, 0.25 OR 0.5 MG/DOSE, 2 MG/3ML SOPN  (Patient not taking: Reported on 06/05/2023)     triamcinolone ointment (KENALOG) 0.1 % Apply 1 Application topically daily as needed.     No current facility-administered medications on file prior to visit.   Social History   Socioeconomic History   Marital status: Single    Spouse name: Not on file   Number of children: Not on file   Years of education: Not on file   Highest education level: Not on file  Occupational History   Not on file  Tobacco Use   Smoking status: Former    Current packs/day: 0.00    Average packs/day: 1.5 packs/day for 42.0 years (63.0 ttl pk-yrs)    Types: Cigarettes    Start date: 12/21/1973    Quit date: 12/22/2015    Years since quitting: 8.4    Passive exposure: Past   Smokeless tobacco: Never  Vaping Use   Vaping status: Never Used  Substance and Sexual Activity   Alcohol use: Yes   Drug use: Yes    Types: Marijuana    Comment: uses every day   Sexual activity: Yes  Other Topics Concern   Not on file  Social History Narrative   Not on file   Social Drivers of Health   Financial Resource Strain: Not on file  Food Insecurity:  Not on file  Transportation Needs: Not on file  Physical Activity: Not on file  Stress: No Stress Concern Present (06/05/2022)   Received from [facility] - Occupational Stress Questionnaire    Feeling of Stress : Not at all  Social Connections: Unknown (01/03/2022)   Received [facility] [place] [facility]   Social Network    Social Network: Not on file  Intimate Partner Violence: Unknown (12/05/2021)   Received from [facility]   HITS    Physically Hurt: Not on file    Insult or Talk Down To: Not on file    Threaten Physical Harm: Not on file    Scream or Curse: Not on file   Family History  Problem Relation Age of Onset   Allergic rhinitis Mother    Urticaria Mother    Diabetes Maternal  Grandmother    Asthma Daughter    Angioedema Neg Hx    Eczema Neg Hx    Immunodeficiency Neg Hx    Stomach cancer Neg Hx    Colon cancer Neg Hx    Allergies  Allergen Reactions   Diphenhydramine Hcl Other (See Comments)    Uncontrolled sneezing   Bacitracin      PE There were no vitals filed for this visit. There is no height or weight on file to calculate BMI.  Physical Exam    Assessment and Plan:        There are no diagnoses linked to this encounter. Brianna FORBES Pa, CMA

## 2024-05-19 ENCOUNTER — Encounter: Payer: Self-pay | Admitting: Obstetrics and Gynecology

## 2024-05-19 ENCOUNTER — Other Ambulatory Visit (HOSPITAL_COMMUNITY)
Admission: RE | Admit: 2024-05-19 | Discharge: 2024-05-19 | Disposition: A | Discharge status: Home / Self Care | Source: Ambulatory Visit | Attending: Obstetrics and Gynecology | Admitting: Obstetrics and Gynecology

## 2024-05-19 ENCOUNTER — Ambulatory Visit (INDEPENDENT_AMBULATORY_CARE_PROVIDER_SITE_OTHER): Admitting: Obstetrics and Gynecology

## 2024-05-19 VITALS — BP 118/72 | HR 71 | Temp 97.8°F | Ht 67.0 in | Wt 344.0 lb

## 2024-05-19 DIAGNOSIS — Z124 Encounter for screening for malignant neoplasm of cervix: Secondary | ICD-10-CM | POA: Diagnosis present

## 2024-05-19 DIAGNOSIS — Z1331 Encounter for screening for depression: Secondary | ICD-10-CM | POA: Diagnosis not present

## 2024-05-19 DIAGNOSIS — Z01419 Encounter for gynecological examination (general) (routine) without abnormal findings: Secondary | ICD-10-CM | POA: Diagnosis not present

## 2024-05-19 NOTE — Assessment & Plan Note (Signed)
 Cervical cancer screening performed according to ASCCP guidelines. Encouraged annual mammogram screening Colonoscopy UTD DXA N/A Labs and immunizations with her primary Encouraged safe sexual practices as indicated Encouraged healthy lifestyle practices with diet and exercise For patients under 50-59yo, I recommend 1200mg  calcium daily and 600IU of vitamin D daily.

## 2024-05-19 NOTE — Patient Instructions (Signed)
 For patients under 50-59yo, I recommend 1200mg  calcium  daily and 600IU of vitamin D daily. For patients over 59yo, I recommend 1200mg  calcium  daily and 800IU of vitamin D daily.  Health Maintenance, Female Adopting a healthy lifestyle and getting preventive care are important in promoting health and wellness. Ask your health care provider about: The right schedule for you to have regular tests and exams. Things you can do on your own to prevent diseases and keep yourself healthy. What should I know about diet, weight, and exercise? Eat a healthy diet  Eat a diet that includes plenty of vegetables, fruits, low-fat dairy products, and lean protein. Do not eat a lot of foods that are high in solid fats, added sugars, or sodium. Maintain a healthy weight Body mass index (BMI) is used to identify weight problems. It estimates body fat based on height and weight. Your health care provider can help determine your BMI and help you achieve or maintain a healthy weight. Get regular exercise Get regular exercise. This is one of the most important things you can do for your health. Most adults should: Exercise for at least 150 minutes each week. The exercise should increase your heart rate and make you sweat (moderate-intensity exercise). Do strengthening exercises at least twice a week. This is in addition to the moderate-intensity exercise. Spend less time sitting. Even light physical activity can be beneficial. Watch cholesterol and blood lipids Have your blood tested for lipids and cholesterol at 59 years of age, then have this test every 5 years. Have your cholesterol levels checked more often if: Your lipid or cholesterol levels are high. You are older than 59 years of age. You are at high risk for heart disease. What should I know about cancer screening? Depending on your health history and family history, you may need to have cancer screening at various ages. This may include screening  for: Breast cancer. Cervical cancer. Colorectal cancer. Skin cancer. Lung cancer. What should I know about heart disease, diabetes, and high blood pressure? Blood pressure and heart disease High blood pressure causes heart disease and increases the risk of stroke. This is more likely to develop in people who have high blood pressure readings or are overweight. Have your blood pressure checked: Every 3-5 years if you are 25-57 years of age. Every year if you are 24 years old or older. Diabetes Have regular diabetes screenings. This checks your fasting blood sugar level. Have the screening done: Once every three years after age 62 if you are at a normal weight and have a low risk for diabetes. More often and at a younger age if you are overweight or have a high risk for diabetes. What should I know about preventing infection? Hepatitis B If you have a higher risk for hepatitis B, you should be screened for this virus. Talk with your health care provider to find out if you are at risk for hepatitis B infection. Hepatitis C Testing is recommended for: Everyone born from [birth year] through 1966. Anyone with known risk factors for hepatitis C. Sexually transmitted infections (STIs) Get screened for STIs, including gonorrhea and chlamydia, if: You are sexually active and are younger than 59 years of age. You are older than 59 years of age and your health care provider tells you that you are at risk for this type of infection. Your sexual activity has changed since you were last screened, and you are at increased risk for chlamydia or gonorrhea. Ask your health care provider if  you are at risk. Ask your health care provider about whether you are at high risk for HIV. Your health care provider may recommend a prescription medicine to help prevent HIV infection. If you choose to take medicine to prevent HIV, you should first get tested for HIV. You should then be tested every 3 months for as long as you  are taking the medicine. Osteoporosis and menopause Osteoporosis is a disease in which the bones lose minerals and strength with aging. This can result in bone fractures. If you are 72 years old or older, or if you are at risk for osteoporosis and fractures, ask your health care provider if you should: Be screened for bone loss. Take a calcium  or vitamin D supplement to lower your risk of fractures. Be given hormone replacement therapy (HRT) to treat symptoms of menopause. Follow these instructions at home: Alcohol use Do not drink alcohol if: Your health care provider tells you not to drink. You are pregnant, may be pregnant, or are planning to become pregnant. If you drink alcohol: Limit how much you have to: 0-1 drink a day. Know how much alcohol is in your drink. In the [place] one drink equals one 12 oz bottle of beer (355 mL), one 5 oz glass of wine (148 mL), or one 1 oz glass of hard liquor (44 mL). Lifestyle Do not use any products that contain nicotine or tobacco. These products include cigarettes, chewing tobacco, and vaping devices, such as e-cigarettes. If you need help quitting, ask your health care provider. Do not use street drugs. Do not share needles. Ask your health care provider for help if you need support or information about quitting drugs. General instructions Schedule regular health, dental, and eye exams. Stay current with your vaccines. Tell your health care provider if: You often feel depressed. You have ever been abused or do not feel safe at home. Summary Adopting a healthy lifestyle and getting preventive care are important in promoting health and wellness. Follow your health care provider's instructions about healthy diet, exercising, and getting tested or screened for diseases. Follow your health care provider's instructions on monitoring your cholesterol and blood pressure. This information is not intended to replace advice given to you by your health  care provider. Make sure you discuss any questions you have with your health care provider. Document Revised: 01/08/2021 Document Reviewed: 01/08/2021 Elsevier Patient Education  2024 [employer].

## 2024-05-20 ENCOUNTER — Ambulatory Visit: Payer: Self-pay | Admitting: Obstetrics and Gynecology

## 2024-05-20 LAB — CYTOLOGY - PAP
Adequacy: ABSENT
Comment: NEGATIVE
Diagnosis: NEGATIVE
High risk HPV: NEGATIVE

## 2024-08-02 ENCOUNTER — Ambulatory Visit: Admitting: Family Medicine
